# Patient Record
Sex: Male | Born: 1955 | Race: White | Hispanic: No | Smoking: Never smoker
Health system: Southern US, Community
[De-identification: ages and names within clinical notes are randomized; demographics above are authoritative.]

## PROBLEM LIST (undated history)

## (undated) DIAGNOSIS — I509 Heart failure, unspecified: Secondary | ICD-10-CM

## (undated) DIAGNOSIS — R0683 Snoring: Secondary | ICD-10-CM

## (undated) DIAGNOSIS — G7000 Myasthenia gravis without (acute) exacerbation: Secondary | ICD-10-CM

## (undated) DIAGNOSIS — I214 Non-ST elevation (NSTEMI) myocardial infarction: Secondary | ICD-10-CM

## (undated) DIAGNOSIS — K922 Gastrointestinal hemorrhage, unspecified: Secondary | ICD-10-CM

## (undated) DIAGNOSIS — I10 Essential (primary) hypertension: Secondary | ICD-10-CM

## (undated) DIAGNOSIS — K219 Gastro-esophageal reflux disease without esophagitis: Secondary | ICD-10-CM

## (undated) DIAGNOSIS — I639 Cerebral infarction, unspecified: Secondary | ICD-10-CM

## (undated) DIAGNOSIS — E7849 Other hyperlipidemia: Secondary | ICD-10-CM

## (undated) HISTORY — DX: Other hyperlipidemia: E78.49

## (undated) HISTORY — DX: Gastrointestinal hemorrhage, unspecified: K92.2

## (undated) HISTORY — DX: Non-ST elevation (NSTEMI) myocardial infarction: I21.4

## (undated) HISTORY — DX: Snoring: R06.83

## (undated) HISTORY — PX: LAPAROSCOPIC CHOLECYSTECTOMY: SUR755

## (undated) HISTORY — DX: Gastro-esophageal reflux disease without esophagitis: K21.9

## (undated) HISTORY — DX: Essential (primary) hypertension: I10

---

## 2013-12-17 DEATH — deceased

## 2016-07-18 DIAGNOSIS — K922 Gastrointestinal hemorrhage, unspecified: Secondary | ICD-10-CM

## 2016-07-18 HISTORY — DX: Gastrointestinal hemorrhage, unspecified: K92.2

## 2016-08-08 DIAGNOSIS — G7000 Myasthenia gravis without (acute) exacerbation: Secondary | ICD-10-CM | POA: Diagnosis present

## 2016-08-08 DIAGNOSIS — I10 Essential (primary) hypertension: Secondary | ICD-10-CM | POA: Diagnosis present

## 2016-08-08 HISTORY — DX: Essential (primary) hypertension: I10

## 2017-05-01 DIAGNOSIS — E7849 Other hyperlipidemia: Secondary | ICD-10-CM | POA: Insufficient documentation

## 2017-05-01 HISTORY — DX: Other hyperlipidemia: E78.49

## 2018-01-03 ENCOUNTER — Inpatient Hospital Stay (HOSPITAL_COMMUNITY)
Admission: EM | Admit: 2018-01-03 | Discharge: 2018-01-06 | DRG: 281 | Disposition: A | Discharge status: Home / Self Care | Payer: BLUE CROSS/BLUE SHIELD | Attending: Internal Medicine | Admitting: Internal Medicine

## 2018-01-03 ENCOUNTER — Encounter (HOSPITAL_COMMUNITY): Payer: Self-pay | Admitting: Emergency Medicine

## 2018-01-03 ENCOUNTER — Emergency Department (HOSPITAL_COMMUNITY): Payer: BLUE CROSS/BLUE SHIELD

## 2018-01-03 DIAGNOSIS — Z886 Allergy status to analgesic agent status: Secondary | ICD-10-CM | POA: Diagnosis not present

## 2018-01-03 DIAGNOSIS — I110 Hypertensive heart disease with heart failure: Secondary | ICD-10-CM

## 2018-01-03 DIAGNOSIS — Z8673 Personal history of transient ischemic attack (TIA), and cerebral infarction without residual deficits: Secondary | ICD-10-CM | POA: Diagnosis not present

## 2018-01-03 DIAGNOSIS — Z881 Allergy status to other antibiotic agents status: Secondary | ICD-10-CM | POA: Diagnosis not present

## 2018-01-03 DIAGNOSIS — G7000 Myasthenia gravis without (acute) exacerbation: Secondary | ICD-10-CM | POA: Diagnosis present

## 2018-01-03 DIAGNOSIS — I5030 Unspecified diastolic (congestive) heart failure: Secondary | ICD-10-CM

## 2018-01-03 DIAGNOSIS — I214 Non-ST elevation (NSTEMI) myocardial infarction: Principal | ICD-10-CM | POA: Diagnosis present

## 2018-01-03 DIAGNOSIS — Z6841 Body Mass Index (BMI) 40.0 and over, adult: Secondary | ICD-10-CM

## 2018-01-03 DIAGNOSIS — Z79899 Other long term (current) drug therapy: Secondary | ICD-10-CM | POA: Diagnosis not present

## 2018-01-03 DIAGNOSIS — N179 Acute kidney failure, unspecified: Secondary | ICD-10-CM | POA: Diagnosis not present

## 2018-01-03 DIAGNOSIS — I5032 Chronic diastolic (congestive) heart failure: Secondary | ICD-10-CM | POA: Diagnosis present

## 2018-01-03 DIAGNOSIS — R749 Abnormal serum enzyme level, unspecified: Secondary | ICD-10-CM

## 2018-01-03 DIAGNOSIS — E785 Hyperlipidemia, unspecified: Secondary | ICD-10-CM | POA: Diagnosis present

## 2018-01-03 DIAGNOSIS — R079 Chest pain, unspecified: Secondary | ICD-10-CM | POA: Diagnosis not present

## 2018-01-03 DIAGNOSIS — R0600 Dyspnea, unspecified: Secondary | ICD-10-CM

## 2018-01-03 DIAGNOSIS — Z888 Allergy status to other drugs, medicaments and biological substances status: Secondary | ICD-10-CM

## 2018-01-03 DIAGNOSIS — I700 Atherosclerosis of aorta: Secondary | ICD-10-CM | POA: Diagnosis present

## 2018-01-03 DIAGNOSIS — I10 Essential (primary) hypertension: Secondary | ICD-10-CM | POA: Diagnosis present

## 2018-01-03 DIAGNOSIS — I447 Left bundle-branch block, unspecified: Secondary | ICD-10-CM | POA: Diagnosis present

## 2018-01-03 DIAGNOSIS — R000 Tachycardia, unspecified: Secondary | ICD-10-CM | POA: Diagnosis present

## 2018-01-03 DIAGNOSIS — I130 Hypertensive heart and chronic kidney disease with heart failure and stage 1 through stage 4 chronic kidney disease, or unspecified chronic kidney disease: Secondary | ICD-10-CM | POA: Diagnosis present

## 2018-01-03 DIAGNOSIS — I252 Old myocardial infarction: Secondary | ICD-10-CM | POA: Insufficient documentation

## 2018-01-03 DIAGNOSIS — I69328 Other speech and language deficits following cerebral infarction: Secondary | ICD-10-CM

## 2018-01-03 DIAGNOSIS — E669 Obesity, unspecified: Secondary | ICD-10-CM | POA: Diagnosis not present

## 2018-01-03 DIAGNOSIS — Z823 Family history of stroke: Secondary | ICD-10-CM

## 2018-01-03 DIAGNOSIS — R011 Cardiac murmur, unspecified: Secondary | ICD-10-CM | POA: Diagnosis present

## 2018-01-03 DIAGNOSIS — R0682 Tachypnea, not elsewhere classified: Secondary | ICD-10-CM | POA: Diagnosis present

## 2018-01-03 DIAGNOSIS — Z7982 Long term (current) use of aspirin: Secondary | ICD-10-CM

## 2018-01-03 DIAGNOSIS — N189 Chronic kidney disease, unspecified: Secondary | ICD-10-CM | POA: Diagnosis present

## 2018-01-03 HISTORY — DX: Old myocardial infarction: I25.2

## 2018-01-03 HISTORY — DX: Essential (primary) hypertension: I10

## 2018-01-03 HISTORY — DX: Myasthenia gravis without (acute) exacerbation: G70.00

## 2018-01-03 HISTORY — DX: Heart failure, unspecified: I50.9

## 2018-01-03 HISTORY — DX: Non-ST elevation (NSTEMI) myocardial infarction: I21.4

## 2018-01-03 HISTORY — DX: Cerebral infarction, unspecified: I63.9

## 2018-01-03 LAB — CBC
HCT: 43.4 % (ref 39.0–52.0)
Hemoglobin: 15 g/dL (ref 13.0–17.0)
MCH: 28.9 pg (ref 26.0–34.0)
MCHC: 34.6 g/dL (ref 30.0–36.0)
MCV: 83.6 fL (ref 78.0–100.0)
PLATELETS: 229 10*3/uL (ref 150–400)
RBC: 5.19 MIL/uL (ref 4.22–5.81)
RDW: 13.2 % (ref 11.5–15.5)
WBC: 7.6 10*3/uL (ref 4.0–10.5)

## 2018-01-03 LAB — I-STAT TROPONIN, ED: TROPONIN I, POC: 0.15 ng/mL — AB (ref 0.00–0.08)

## 2018-01-03 LAB — BASIC METABOLIC PANEL
ANION GAP: 13 (ref 5–15)
BUN: 16 mg/dL (ref 6–20)
CALCIUM: 9.9 mg/dL (ref 8.9–10.3)
CHLORIDE: 106 mmol/L (ref 101–111)
CO2: 22 mmol/L (ref 22–32)
Creatinine, Ser: 1.47 mg/dL — ABNORMAL HIGH (ref 0.61–1.24)
GFR, EST AFRICAN AMERICAN: 57 mL/min — AB (ref >60)
GFR, EST NON AFRICAN AMERICAN: 49 mL/min — AB (ref >60)
Glucose, Bld: 141 mg/dL — ABNORMAL HIGH (ref 65–99)
POTASSIUM: 3.7 mmol/L (ref 3.5–5.1)
SODIUM: 141 mmol/L (ref 135–145)

## 2018-01-03 LAB — BRAIN NATRIURETIC PEPTIDE: B Natriuretic Peptide: 13.2 pg/mL (ref 0.0–100.0)

## 2018-01-03 LAB — TROPONIN I
Troponin I: 0.14 ng/mL (ref <0.03)
Troponin I: 8.29 ng/mL (ref <0.03)

## 2018-01-03 MED ORDER — IOPAMIDOL (ISOVUE-370) INJECTION 76%
INTRAVENOUS | Status: AC
  Filled 2018-01-03: qty 100

## 2018-01-03 MED ORDER — IOPAMIDOL (ISOVUE-370) INJECTION 76%: 100.0000 mL | Freq: Once | INTRAVENOUS | Status: DC | PRN

## 2018-01-03 MED ORDER — HEPARIN (PORCINE) IN NACL 100-0.45 UNIT/ML-% IJ SOLN
1550.0000 [IU]/h | INTRAMUSCULAR | Status: DC
  Administered 2018-01-03: 1500 [IU]/h via INTRAVENOUS
  Administered 2018-01-05: 1550 [IU]/h via INTRAVENOUS
  Filled 2018-01-03 (×4): qty 250

## 2018-01-03 MED ORDER — IOPAMIDOL (ISOVUE-370) INJECTION 76%
100.0000 mL | Freq: Once | INTRAVENOUS | Status: AC | PRN
  Administered 2018-01-03: 100 mL via INTRAVENOUS

## 2018-01-03 MED ORDER — FUROSEMIDE 10 MG/ML IJ SOLN
40.0000 mg | Freq: Once | INTRAMUSCULAR | Status: AC
  Administered 2018-01-03: 40 mg via INTRAVENOUS
  Filled 2018-01-03: qty 4

## 2018-01-03 NOTE — H&P (Addendum)
Date: 01/03/2018               Patient Name:  Michael Webster MRN: [id number]  DOB: [birth year] Age / Sex: 62 y.o., male   PCP: Clide Dales, PA         Medical Service: Internal Medicine Teaching Service         Attending Physician: Dr. Earl Lagos, MD    First Contact: Dr. Minda Meo  Pager: [phone]  Second Contact: Dr. Nelson Chimes Pager: [phone]       After Hours (After 5p/  First Contact Pager: [phone]  weekends / holidays): Second Contact Pager: [phone]    Chief Complaint: chest pain, SOB  History of Present Illness: 62 year old male with past medical history of myasthenia gravis severe requiring intubation, ?HFpEF, CVA 2010, HTN, HLD.  He presented after calling an ambulance due to substernal chest pain and pressure that began in the [place] parking lot after buying his groceries.  He was putting them in the car and he suddenly felt intense chest pain with associated shortness of breath where he felt like someone was sitting on his chest.  The pain did not radiate, it was constant and unrelenting, it was not pleuritic. he attempted to drive to what he thought was urgent care but when he went into the shopping center there was no urgent care center he flex someone down to call EMS.  When EMS arrived he was given nitroglycerin and oxygen which relieved his chest pain and he felt less much less short of breath as well.  He denies any current chest pain or shortness of breath.  He denied any nausea or vomiting, diarrhea.  He reports no vision changes. He has never had a heart attack before, never had any chest pain symptoms before.  He reports being compliant with all his medicines for blood pressure cholesterol and myasthenia.    ED course: Patient arrived mildly tachycardic, mildly tachypneic.  A CT angiogram chest was performed which was negative for pulmonary embolism.  The patient's labs were significant for an elevation in troponin to 0.15 i-STAT followed by another result 0.14  regular troponin.  BNP was 13.2 CBC and BMP significant for a 1.47 creatinine which is above the patient's baseline of around 1.  The patient received a dose of IV Lasix 40 mg. cardiology was consulted the patient was started on heparin.    Meds:  Current Meds  Medication Sig  . amLODipine (NORVASC) 10 MG tablet Take 5 mg by mouth daily.  Marland Kitchen aspirin EC 81 MG tablet Take 81 mg by mouth daily.  Marland Kitchen docusate sodium (COLACE) 100 MG capsule Take 100 mg by mouth daily as needed for mild constipation.  Marland Kitchen escitalopram (LEXAPRO) 10 MG tablet Take 10 mg by mouth daily.  . famotidine (PEPCID) 20 MG tablet Take 20 mg by mouth 2 (two) times daily.  . hydrochlorothiazide (HYDRODIURIL) 25 MG tablet Take 25 mg by mouth daily.  Marland Kitchen ketotifen (ZADITOR) 0.025 % ophthalmic solution Place 1 drop into both eyes daily as needed.  Marland Kitchen losartan (COZAAR) 50 MG tablet Take 50 mg by mouth daily.  . mycophenolate (CELLCEPT) 500 MG tablet Take 500 mg by mouth 2 (two) times daily.  . pravastatin (PRAVACHOL) 40 MG tablet Take 40 mg by mouth every evening.  . pyridostigmine (MESTINON) 60 MG tablet Take 60 mg by mouth 4 (four) times daily.  Marland Kitchen senna (SENOKOT) 8.6 MG TABS tablet Take 1 tablet by mouth daily as needed for  mild constipation.     Allergies: Allergies as of 01/03/2018 - Review Complete 01/03/2018  Allergen Reaction Noted  . Beta adrenergic blockers Other (See Comments) 08/08/2016  . Calcium channel blockers Other (See Comments) 08/29/2016  . Naproxen Other (See Comments) 08/08/2016  . Duexis [ibuprofen-famotidine]  01/03/2018  . Erythromycin Other (See Comments) 01/03/2018   Past Medical History:  Diagnosis Date  . Congestive heart failure (CHF) (HCC)   . Hypertension   . Myasthenia gravis (HCC)   . Stroke [facility])     Family History:  Family History  Problem Relation Age of Onset  . Stroke Father   . Heart attack Neg Hx     Social History:  Social History   Socioeconomic History  . Marital status:  Widowed    Spouse name: Not on file  . Number of children: Not on file  . Years of education: Not on file  . Highest education level: Not on file  Occupational History  . Not on file  Social Needs  . Financial resource strain: Not on file  . Food insecurity:    Worry: Not on file    Inability: Not on file  . Transportation needs:    Medical: Not on file    Non-medical: Not on file  Tobacco Use  . Smoking status: Never Smoker  . Smokeless tobacco: Never Used  Substance and Sexual Activity  . Alcohol use: Yes    Comment: Occasional use  . Drug use: Never  . Sexual activity: Not on file  Lifestyle  . Physical activity:    Days per week: Not on file    Minutes per session: Not on file  . Stress: Not on file  Relationships  . Social connections:    Talks on phone: Not on file    Gets together: Not on file    Attends religious service: Not on file    Active member of club or organization: Not on file    Attends meetings of clubs or organizations: Not on file    Relationship status: Not on file  . Intimate partner violence:    Fear of current or ex partner: Not on file    Emotionally abused: Not on file    Physically abused: Not on file    Forced sexual activity: Not on file  Other Topics Concern  . Not on file  Social History Narrative  . Not on file    Review of Systems: A complete ROS was negative except as per HPI.   Physical Exam: Blood pressure (!) 143/100, pulse 86, temperature 97.9 F (36.6 C), temperature source Oral, resp. rate 17, SpO2 94 %. Physical Exam  Constitutional: He appears well-developed and well-nourished.  HENT:  Head: Normocephalic and atraumatic.  Eyes: Right eye exhibits no discharge. Left eye exhibits no discharge. No scleral icterus.  Neck: No JVD present.  Cardiovascular: Normal rate, regular rhythm and intact distal pulses. Exam reveals distant heart sounds. Exam reveals no gallop and no friction rub.  No murmur  heard. Pulmonary/Chest: Effort normal and breath sounds normal. No respiratory distress. He has no wheezes. He has no rales.  Abdominal: Soft. Bowel sounds are normal. He exhibits no distension and no mass. There is no tenderness. There is no guarding.  Musculoskeletal:       Right lower leg: He exhibits edema (trace).       Left lower leg: He exhibits edema (trace).  Neurological: He is alert.  Skin: Skin is warm and dry.  EKG: personally reviewed my interpretation is SR, LBBB (chronic), LAE  CXR: personally reviewed my interpretation is no pleural effusions, possible very mild interstitial edema  Assessment & Plan by Problem: Active Problems:   Chest pain  NSTEMI: pt with classic anginal symptoms relieved by nitro, elevation in troponin to 0.14.  ECG changes chronic LBBB, vitals stable.  Pt with risk factors for CAD obesity, HLD, HTN.  Cardiology consulted and following  -continue heparin -asa -ECHO -repeat ECG -trend troponins -PRN SL Nitro -holding losartan with AKI  -continue pravastatin  get lipid panel decide risk vs benefit given could exacerbate MG if using higher intensity statin -as pt with no anginal symptoms currently would not add BB as could exacerbate MG also -Hgb A1C  AKI: pt with baseline Cr 1.00, here found to be 1.47 given contrast load with CTA and given IV lasix   -hold nephrotoxic agents -repeat BMP   Myasthenia Gravis: severe, 2 years ago required intubation, plasmapharesis, disease activity has been stable as of late.  Follows with Dr. Asencion Gowda Neurology had appt last month.  -continue cellcept  BID -continue mestinon  QID   Dispo: Admit patient to Inpatient with expected length of stay greater than 2 midnights.  Signed: Angelita Ingles, MD 01/03/2018, 9:49 PM

## 2018-01-03 NOTE — ED Provider Notes (Signed)
[facility] EMERGENCY DEPARTMENT Provider Note   CSN: [id number] Arrival date & time: 01/03/18  1635     History   Chief Complaint Chief Complaint  Patient presents with  . Shortness of Breath    HPI Clearnce Michael Webster is a 62 y.o. male.  Patient with history of myasthenia gravis compliant with medications, congestive heart pill compliant with blood pressure medications presents with worsening chest pressure and shortness of breath. It started in the parking lot at the grocery store and he asked him to call 911. Sudden onset shortness of breath.Patient is had sores breath before however unable to tell if different. Possibly weight gain recently. Patient denies blood clot history, recent surgeries. Patient normally goes to other hospital for care.patient says symptoms exertional. No fevers.     Past Medical History:  Diagnosis Date  . Myasthenia gravis (HCC)     There are no active problems to display for this patient.   History reviewed. No pertinent surgical history.      Home Medications    Prior to Admission medications   Medication Sig Start Date End Date Taking? Authorizing Provider  amLODipine (NORVASC) 10 MG tablet Take 5 mg by mouth daily.   Yes [provider]  aspirin EC 81 MG tablet Take 81 mg by mouth daily.   Yes [provider]  docusate sodium (COLACE) 100 MG capsule Take 100 mg by mouth daily as needed for mild constipation.   Yes [provider]  escitalopram (LEXAPRO) 10 MG tablet Take 10 mg by mouth daily.   Yes [provider]  famotidine (PEPCID) 20 MG tablet Take 20 mg by mouth 2 (two) times daily.   Yes [provider]  hydrochlorothiazide (HYDRODIURIL) 25 MG tablet Take 25 mg by mouth daily.   Yes [provider]  ketotifen (ZADITOR) 0.025 % ophthalmic solution Place 1 drop into both eyes daily as needed.   Yes [provider]  losartan (COZAAR) 50 MG tablet Take 50 mg by  mouth daily.   Yes [provider]  mycophenolate (CELLCEPT) 500 MG tablet Take 500 mg by mouth 2 (two) times daily.   Yes [provider]  pravastatin (PRAVACHOL) 40 MG tablet Take 40 mg by mouth every evening.   Yes [provider]  pyridostigmine (MESTINON) 60 MG tablet Take 60 mg by mouth 4 (four) times daily.   Yes [provider]  senna (SENOKOT) 8.6 MG TABS tablet Take 1 tablet by mouth daily as needed for mild constipation.   Yes [provider]    Family History No family history on file.  Social History Social History   Tobacco Use  . Smoking status: Not on file  Substance Use Topics  . Alcohol use: Not on file  . Drug use: Not on file     Allergies   Beta adrenergic blockers; Calcium channel blockers; Naproxen; Duexis [ibuprofen-famotidine]; and Erythromycin   Review of Systems Review of Systems  Constitutional: Negative for chills and fever.  HENT: Negative for congestion.   Eyes: Negative for visual disturbance.  Respiratory: Positive for shortness of breath. Negative for cough.   Cardiovascular: Positive for chest pain and leg swelling.  Gastrointestinal: Negative for abdominal pain and vomiting.  Genitourinary: Negative for dysuria and flank pain.  Musculoskeletal: Negative for back pain, neck pain and neck stiffness.  Skin: Negative for rash.  Neurological: Negative for light-headedness and headaches.     Physical Exam Updated Vital Signs BP 116/80  Pulse (!) 104   Temp 97.9 F (36.6 C) (Oral)   Resp 18   SpO2 96%   Physical Exam  Constitutional: He is oriented to person, place, and time. He appears well-developed and well-nourished.  HENT:  Head: Normocephalic and atraumatic.  Eyes: Conjunctivae are normal. Right eye exhibits no discharge. Left eye exhibits no discharge.  Neck: Normal range of motion. Neck supple. No tracheal deviation present.  Cardiovascular: Normal rate and regular rhythm.    Pulmonary/Chest: Tachypnea noted. No respiratory distress. He has no rales.  Abdominal: Soft. He exhibits no distension. There is no tenderness. There is no guarding.  Musculoskeletal:       Right lower leg: He exhibits edema.       Left lower leg: He exhibits edema.  Neurological: He is alert and oriented to person, place, and time.  Skin: Skin is warm. No rash noted.  Psychiatric: He has a normal mood and affect.  Nursing note and vitals reviewed.    ED Treatments / Results  Labs (all labs ordered are listed, but only abnormal results are displayed) Labs Reviewed  BASIC METABOLIC PANEL - Abnormal; Notable for the following components:      Result Value   Glucose, Bld 141 (*)    Creatinine, Ser 1.47 (*)    GFR calc non Af Amer 49 (*)    GFR calc Af Amer 57 (*)    All other components within normal limits  TROPONIN I - Abnormal; Notable for the following components:   Troponin I 0.14 (*)    All other components within normal limits  I-STAT TROPONIN, ED - Abnormal; Notable for the following components:   Troponin i, poc 0.15 (*)    All other components within normal limits  CBC  BRAIN NATRIURETIC PEPTIDE    EKG EKG Interpretation  Date/Time:  Saturday Jan 03 2018 16:40:32 EDT Ventricular Rate:  115 PR Interval:    QRS Duration: 128 QT Interval:  346 QTC Calculation: 479 R Axis:   -46 Text Interpretation:  Sinus tachycardia Probable left atrial enlargement LVH with IVCD, LAD and secondary repol abnrm Anterior ST elevation, probably due to LVH Borderline prolonged QT interval Confirmed by Blane Ohara [phone]) on 01/03/2018 4:56:36 PM   Radiology Dg Chest 2 View  Result Date: 01/03/2018 CLINICAL DATA:  Shortness of breath, chest pain EXAM: CHEST - 2 VIEW COMPARISON:  None. FINDINGS: Cardiomegaly. Mild vascular congestion and interstitial prominence. No confluent opacities or effusions. No acute bony abnormality. IMPRESSION: Cardiomegaly with vascular congestion.  Interstitial prominence could reflect early interstitial edema. Electronically Signed   By: Charlett Nose M.D.   On: 01/03/2018 17:12   Ct Angio Chest Pe W And/or Wo Contrast  Result Date: 01/03/2018 CLINICAL DATA:  Shortness of Breath EXAM: CT ANGIOGRAPHY CHEST WITH CONTRAST TECHNIQUE: Multidetector CT imaging of the chest was performed using the standard protocol during bolus administration of intravenous contrast. Multiplanar CT image reconstructions and MIPs were obtained to evaluate the vascular anatomy. CONTRAST:  ISOVUE-370 IOPAMIDOL (ISOVUE-370) INJECTION 76% COMPARISON:  None. FINDINGS: Cardiovascular: Heart is normal size. Aorta is normal caliber. Scattered aortic calcifications. No filling defects in the pulmonary arteries to suggest pulmonary emboli. Mediastinum/Nodes: No mediastinal, hilar, or axillary adenopathy. Lungs/Pleura: Lungs are clear. No focal airspace opacities or suspicious nodules. No effusions. Upper Abdomen: Imaging into the upper abdomen shows no acute findings. Musculoskeletal: Chest wall soft tissues are unremarkable. No acute bony abnormality. Review of the MIP images confirms the above findings. IMPRESSION: No  evidence of pulmonary embolus. No acute cardiopulmonary disease. Electronically Signed   By: Charlett Nose M.D.   On: 01/03/2018 19:35    Procedures .Critical Care Performed by: Blane Ohara, MD Authorized by: Blane Ohara, MD   Critical care provider statement:    Critical care time (minutes):  35   Critical care start time:  01/03/2018 7:00 PM   Critical care end time:  01/03/2018 7:35 PM   Critical care time was exclusive of:  Separately billable procedures and treating other patients and teaching time   Critical care was necessary to treat or prevent imminent or life-threatening deterioration of the following conditions:  Cardiac failure   Critical care was time spent personally by me on the following activities:  Evaluation of patient's response to  treatment, examination of patient, ordering and performing treatments and interventions, ordering and review of laboratory studies, ordering and review of radiographic studies, pulse oximetry and discussions with consultants   I assumed direction of critical care for this patient from another provider in my specialty: no     (including critical care time)  Medications Ordered in ED Medications  iopamidol (ISOVUE-370) 76 % injection 100 mL (has no administration in time range)  iopamidol (ISOVUE-370) 76 % injection (has no administration in time range)  furosemide (LASIX) injection 40 mg (40 mg Intravenous Given 01/03/18 1825)  iopamidol (ISOVUE-370) 76 % injection 100 mL (100 mLs Intravenous Contrast Given 01/03/18 1911)     Initial Impression / Assessment and Plan / ED Course  I have reviewed the triage vital signs and the nursing notes.  Pertinent labs & imaging results that were available during my care of the patient were reviewed by me and considered in my medical decision making (see chart for details).    Patient with history of heart failure my senior presents with sudden onsets hortness breath and chest pressure exertional. Patient has abnormal EKG on arrival no old to compare to was able to find old EKG reports in care of everywhere which showed LVH-like pattern and widened QRS. Page cardiology with elevated troponin. CT neg PE.  Trop elevated.   Discussed with cardiology recommended heparin and medicine admission as patient has significant myasthenia gravis complicated medications and they would appreciate their help.  Paged unassigned. The patients results and plan were reviewed and discussed.   Any x-rays performed were independently reviewed by myself.   Differential diagnosis were considered with the presenting HPI.  Medications  iopamidol (ISOVUE-370) 76 % injection 100 mL (has no administration in time range)  iopamidol (ISOVUE-370) 76 % injection (has no  administration in time range)  furosemide (LASIX) injection 40 mg (40 mg Intravenous Given 01/03/18 1825)  iopamidol (ISOVUE-370) 76 % injection 100 mL (100 mLs Intravenous Contrast Given 01/03/18 1911)    Vitals:   01/03/18 1700 01/03/18 1800 01/03/18 1830 01/03/18 1945  BP: 92/61  116/80   Pulse: (!) 113 (!) 113 (!) 103 (!) 104  Resp: 19 (!) Temp:      TempSrc:      SpO2: 95% 98% 95% 96%    Final diagnoses:  NSTEMI (non-ST elevated myocardial infarction) (HCC)  Acute dyspnea    Admission/ observation were discussed with the admitting physician, patient and/or family and they are comfortable with the plan.   Final Clinical Impressions(s) / ED Diagnoses   Final diagnoses:  NSTEMI (non-ST elevated myocardial infarction) [facility])  Acute dyspnea    ED Discharge Orders    None  Blane Ohara, MD 01/03/18 2023
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
## 2018-01-03 NOTE — ED Notes (Signed)
Paged hospitalist for troponin value

## 2018-01-03 NOTE — ED Notes (Signed)
Writer notified EDP Zavitz abnormal I stat trop result.

## 2018-01-03 NOTE — Progress Notes (Signed)
ANTICOAGULATION CONSULT NOTE - Initial Consult  Pharmacy Consult for Heparin Indication: chest pain/ACS  Allergies  Allergen Reactions  . Beta Adrenergic Blockers Other (See Comments)    Hx of Myasthenia Gravis  . Calcium Channel Blockers Other (See Comments)    Hx of Myasthenia Gravis  . Naproxen Other (See Comments)    Bleeding ulcer  . Duexis [Ibuprofen-Famotidine]     Unknown   . Erythromycin Other (See Comments)    Patient Measurements: Ht. 5'10 Wt. 106.7kg Heparin Dosing Weight: 96KG  Vital Signs: Temp: 97.9 F (36.6 C) (05/18 1641) Temp Source: Oral (05/18 1641) BP: 116/80 (05/18 1830) Pulse Rate: 104 (05/18 1945)  Labs: Recent Labs    01/03/18 1709  HGB 15.0  HCT 43.4  PLT 229  CREATININE 1.47*  TROPONINI 0.14*    CrCl cannot be calculated (Unknown ideal weight.).  Medical History: Past Medical History:  Diagnosis Date  . Myasthenia gravis [facility])    Assessment: 62yo male presents with ongoing chest pressure with shortness of breath.  CT neg. For PE and we have been asked to initiate Heparin therapy for ACS.  He is ~ 107kg and 70 inches.  Will use heparin adjusted body weight for dosing.  His CBC is wnl and platelets are 229K.  He is without noted bleeding.  Goal of Therapy:  Heparin level 0.3-0.7 units/ml Monitor platelets by anticoagulation protocol: Yes   Plan:  Heparin 5000 units IV bolus then begin maintenance dose with continuous infusion at 1500 units/hr Check HL in 6 hours and adjust as needed. Monitor daily CBC/HL  Nadara Mustard, PharmD., MS Clinical Pharmacist Pager:  [phone] Thank you for allowing pharmacy to be part of this patients care team. 01/03/2018,8:28 PM

## 2018-01-03 NOTE — ED Triage Notes (Signed)
Per EMS pt from home had acute onset SOB, pt was pale and clammy initially, pt arousal to stimulation, no cardiac h/s, pt was having chest pain no chest pain currently, given 324 mg aspirin, 1 nitro given with pain resolved. 93% 3L, 146/50, 130 HR, RR 24, CBG 146

## 2018-01-04 ENCOUNTER — Other Ambulatory Visit: Payer: Self-pay

## 2018-01-04 ENCOUNTER — Encounter (HOSPITAL_COMMUNITY): Payer: Self-pay | Admitting: *Deleted

## 2018-01-04 DIAGNOSIS — Z881 Allergy status to other antibiotic agents status: Secondary | ICD-10-CM | POA: Diagnosis not present

## 2018-01-04 DIAGNOSIS — I5032 Chronic diastolic (congestive) heart failure: Secondary | ICD-10-CM | POA: Diagnosis present

## 2018-01-04 DIAGNOSIS — Z6841 Body Mass Index (BMI) 40.0 and over, adult: Secondary | ICD-10-CM | POA: Diagnosis not present

## 2018-01-04 DIAGNOSIS — E785 Hyperlipidemia, unspecified: Secondary | ICD-10-CM | POA: Diagnosis present

## 2018-01-04 DIAGNOSIS — E669 Obesity, unspecified: Secondary | ICD-10-CM | POA: Diagnosis not present

## 2018-01-04 DIAGNOSIS — I447 Left bundle-branch block, unspecified: Secondary | ICD-10-CM | POA: Diagnosis present

## 2018-01-04 DIAGNOSIS — Z7982 Long term (current) use of aspirin: Secondary | ICD-10-CM

## 2018-01-04 DIAGNOSIS — N189 Chronic kidney disease, unspecified: Secondary | ICD-10-CM | POA: Diagnosis present

## 2018-01-04 DIAGNOSIS — Z79899 Other long term (current) drug therapy: Secondary | ICD-10-CM | POA: Diagnosis not present

## 2018-01-04 DIAGNOSIS — Z9861 Coronary angioplasty status: Secondary | ICD-10-CM | POA: Diagnosis not present

## 2018-01-04 DIAGNOSIS — I700 Atherosclerosis of aorta: Secondary | ICD-10-CM | POA: Diagnosis present

## 2018-01-04 DIAGNOSIS — I214 Non-ST elevation (NSTEMI) myocardial infarction: Secondary | ICD-10-CM | POA: Diagnosis present

## 2018-01-04 DIAGNOSIS — N179 Acute kidney failure, unspecified: Secondary | ICD-10-CM | POA: Diagnosis present

## 2018-01-04 DIAGNOSIS — Z888 Allergy status to other drugs, medicaments and biological substances status: Secondary | ICD-10-CM | POA: Diagnosis not present

## 2018-01-04 DIAGNOSIS — I10 Essential (primary) hypertension: Secondary | ICD-10-CM | POA: Diagnosis not present

## 2018-01-04 DIAGNOSIS — Z0389 Encounter for observation for other suspected diseases and conditions ruled out: Secondary | ICD-10-CM | POA: Diagnosis not present

## 2018-01-04 DIAGNOSIS — I130 Hypertensive heart and chronic kidney disease with heart failure and stage 1 through stage 4 chronic kidney disease, or unspecified chronic kidney disease: Secondary | ICD-10-CM | POA: Diagnosis present

## 2018-01-04 DIAGNOSIS — R0682 Tachypnea, not elsewhere classified: Secondary | ICD-10-CM | POA: Diagnosis present

## 2018-01-04 DIAGNOSIS — I5030 Unspecified diastolic (congestive) heart failure: Secondary | ICD-10-CM | POA: Diagnosis not present

## 2018-01-04 DIAGNOSIS — Z886 Allergy status to analgesic agent status: Secondary | ICD-10-CM | POA: Diagnosis not present

## 2018-01-04 DIAGNOSIS — R011 Cardiac murmur, unspecified: Secondary | ICD-10-CM | POA: Diagnosis present

## 2018-01-04 DIAGNOSIS — G4700 Insomnia, unspecified: Secondary | ICD-10-CM

## 2018-01-04 DIAGNOSIS — G7000 Myasthenia gravis without (acute) exacerbation: Secondary | ICD-10-CM | POA: Diagnosis present

## 2018-01-04 DIAGNOSIS — I110 Hypertensive heart disease with heart failure: Secondary | ICD-10-CM | POA: Diagnosis not present

## 2018-01-04 DIAGNOSIS — Z8673 Personal history of transient ischemic attack (TIA), and cerebral infarction without residual deficits: Secondary | ICD-10-CM | POA: Diagnosis not present

## 2018-01-04 DIAGNOSIS — Z823 Family history of stroke: Secondary | ICD-10-CM | POA: Diagnosis not present

## 2018-01-04 DIAGNOSIS — R000 Tachycardia, unspecified: Secondary | ICD-10-CM | POA: Diagnosis present

## 2018-01-04 DIAGNOSIS — I69328 Other speech and language deficits following cerebral infarction: Secondary | ICD-10-CM | POA: Diagnosis not present

## 2018-01-04 LAB — TROPONIN I: TROPONIN I: 3.92 ng/mL — AB (ref <0.03)

## 2018-01-04 LAB — BASIC METABOLIC PANEL
ANION GAP: 15 (ref 5–15)
BUN: 15 mg/dL (ref 6–20)
CALCIUM: 9.7 mg/dL (ref 8.9–10.3)
CO2: 21 mmol/L — ABNORMAL LOW (ref 22–32)
Chloride: 104 mmol/L (ref 101–111)
Creatinine, Ser: 1.27 mg/dL — ABNORMAL HIGH (ref 0.61–1.24)
GFR calc Af Amer: >60 mL/min (ref >60)
GFR calc non Af Amer: 59 mL/min — ABNORMAL LOW (ref >60)
GLUCOSE: 127 mg/dL — AB (ref 65–99)
Potassium: 3.6 mmol/L (ref 3.5–5.1)
Sodium: 140 mmol/L (ref 135–145)

## 2018-01-04 LAB — LIPID PANEL
CHOLESTEROL: 172 mg/dL (ref 0–200)
HDL: 54 mg/dL (ref >40)
LDL Cholesterol: 94 mg/dL (ref 0–99)
TRIGLYCERIDES: 122 mg/dL (ref <150)
Total CHOL/HDL Ratio: 3.2 RATIO
VLDL: 24 mg/dL (ref 0–40)

## 2018-01-04 LAB — CBC
HEMATOCRIT: 43.9 % (ref 39.0–52.0)
Hemoglobin: 15 g/dL (ref 13.0–17.0)
MCH: 28.7 pg (ref 26.0–34.0)
MCHC: 34.2 g/dL (ref 30.0–36.0)
MCV: 83.9 fL (ref 78.0–100.0)
Platelets: 223 10*3/uL (ref 150–400)
RBC: 5.23 MIL/uL (ref 4.22–5.81)
RDW: 13.6 % (ref 11.5–15.5)
WBC: 7.1 10*3/uL (ref 4.0–10.5)

## 2018-01-04 LAB — MRSA PCR SCREENING: MRSA BY PCR: NEGATIVE

## 2018-01-04 LAB — HEPARIN LEVEL (UNFRACTIONATED)
HEPARIN UNFRACTIONATED: 0.44 [IU]/mL (ref 0.30–0.70)
Heparin Unfractionated: 0.36 IU/mL (ref 0.30–0.70)

## 2018-01-04 LAB — HIV ANTIBODY (ROUTINE TESTING W REFLEX): HIV Screen 4th Generation wRfx: NONREACTIVE

## 2018-01-04 LAB — HEMOGLOBIN A1C
Hgb A1c MFr Bld: 5.3 % (ref 4.8–5.6)
MEAN PLASMA GLUCOSE: 105.41 mg/dL

## 2018-01-04 MED ORDER — FAMOTIDINE 20 MG PO TABS
20.0000 mg | ORAL_TABLET | Freq: Two times a day (BID) | ORAL | Status: DC
Start: 2018-01-04 — End: 2018-01-06
  Administered 2018-01-04 – 2018-01-06 (×5): 20 mg via ORAL
  Filled 2018-01-04 (×5): qty 1

## 2018-01-04 MED ORDER — ASPIRIN 81 MG PO CHEW
81.0000 mg | CHEWABLE_TABLET | ORAL | Status: AC
  Administered 2018-01-05: 81 mg via ORAL
  Filled 2018-01-04: qty 1

## 2018-01-04 MED ORDER — SODIUM CHLORIDE 0.9 % IV SOLN
INTRAVENOUS | Status: AC
  Administered 2018-01-04: 10:00:00 via INTRAVENOUS

## 2018-01-04 MED ORDER — SODIUM CHLORIDE 0.9% FLUSH: 3.0000 mL | INTRAVENOUS | Status: DC | PRN

## 2018-01-04 MED ORDER — DOCUSATE SODIUM 100 MG PO CAPS: 100.0000 mg | ORAL_CAPSULE | Freq: Every day | ORAL | Status: DC | PRN

## 2018-01-04 MED ORDER — ESCITALOPRAM OXALATE 10 MG PO TABS
10.0000 mg | ORAL_TABLET | Freq: Every day | ORAL | Status: DC
  Filled 2018-01-04: qty 1

## 2018-01-04 MED ORDER — PYRIDOSTIGMINE BROMIDE 60 MG PO TABS
60.0000 mg | ORAL_TABLET | Freq: Four times a day (QID) | ORAL | Status: DC
  Administered 2018-01-04 – 2018-01-06 (×8): 60 mg via ORAL
  Filled 2018-01-04 (×9): qty 1

## 2018-01-04 MED ORDER — PRAVASTATIN SODIUM 40 MG PO TABS
40.0000 mg | ORAL_TABLET | Freq: Every evening | ORAL | Status: DC
  Administered 2018-01-04 – 2018-01-05 (×2): 40 mg via ORAL
  Filled 2018-01-04 (×2): qty 1

## 2018-01-04 MED ORDER — PYRIDOSTIGMINE BROMIDE 60 MG PO TABS
60.0000 mg | ORAL_TABLET | Freq: Four times a day (QID) | ORAL | Status: DC
  Administered 2018-01-04: 60 mg via ORAL
  Filled 2018-01-04 (×2): qty 1

## 2018-01-04 MED ORDER — ASPIRIN EC 81 MG PO TBEC
81.0000 mg | DELAYED_RELEASE_TABLET | Freq: Every day | ORAL | Status: DC
  Administered 2018-01-04 – 2018-01-06 (×2): 81 mg via ORAL
  Filled 2018-01-04 (×2): qty 1

## 2018-01-04 MED ORDER — AMLODIPINE BESYLATE 5 MG PO TABS
5.0000 mg | ORAL_TABLET | Freq: Every day | ORAL | Status: DC
  Administered 2018-01-04 – 2018-01-06 (×3): 5 mg via ORAL
  Filled 2018-01-04 (×3): qty 1

## 2018-01-04 MED ORDER — SODIUM CHLORIDE 0.9 % WEIGHT BASED INFUSION
3.0000 mL/kg/h | INTRAVENOUS | Status: DC
  Administered 2018-01-05: 3 mL/kg/h via INTRAVENOUS

## 2018-01-04 MED ORDER — SODIUM CHLORIDE 0.9% FLUSH
3.0000 mL | Freq: Two times a day (BID) | INTRAVENOUS | Status: DC
  Administered 2018-01-05: 3 mL via INTRAVENOUS

## 2018-01-04 MED ORDER — SODIUM CHLORIDE 0.9 % IV SOLN: 250.0000 mL | INTRAVENOUS | Status: DC | PRN

## 2018-01-04 MED ORDER — MYCOPHENOLATE MOFETIL 250 MG PO CAPS
500.0000 mg | ORAL_CAPSULE | Freq: Two times a day (BID) | ORAL | Status: DC
  Administered 2018-01-04 – 2018-01-06 (×6): 500 mg via ORAL
  Filled 2018-01-04 (×7): qty 2

## 2018-01-04 MED ORDER — SENNA 8.6 MG PO TABS: 1.0000 | ORAL_TABLET | Freq: Every day | ORAL | Status: DC | PRN

## 2018-01-04 MED ORDER — PYRIDOSTIGMINE BROMIDE 60 MG PO TABS
60.0000 mg | ORAL_TABLET | Freq: Four times a day (QID) | ORAL | Status: DC
  Filled 2018-01-04 (×3): qty 1

## 2018-01-04 MED ORDER — SODIUM CHLORIDE 0.9 % WEIGHT BASED INFUSION: 1.0000 mL/kg/h | INTRAVENOUS | Status: DC

## 2018-01-04 MED ORDER — NITROGLYCERIN 0.4 MG SL SUBL: 0.4000 mg | SUBLINGUAL_TABLET | SUBLINGUAL | Status: DC | PRN

## 2018-01-04 NOTE — ED Notes (Signed)
Per pharmacy give cellcept even though NPO status

## 2018-01-04 NOTE — Progress Notes (Signed)
   Subjective:  Patient seen and examined. He denies any chest pain overnight and has not had CP since receiving SL nitro from EMS. He denies shortness of breath. He is concerned about receiving his myasthenia gravis medication on time as it effects his speech. Otherwise, no acute complaints.   Objective:  Vital signs in last 24 hours: Vitals:   01/04/18 0500 01/04/18 0515 01/04/18 0545 01/04/18 0600  BP: (!) 149/95 (!) 144/81 134/74 126/86  Pulse: 85 81 89 84  Resp: 15 (!) Temp:      TempSrc:      SpO2: 95% 90% 96% 94%   General: Sitting up in bed comfortably, NAD HEENT: [place]/AT, EOMI, no scleral icterus Cardiac: RRR, No R/M/G appreciated Pulm: normal effort, CTAB Abd: soft, non tender, non distended, BS normal Ext: extremities well perfused, no peripheral edema Neuro: alert and oriented X3, cranial nerves II-XII grossly intact   Assessment/Plan:  Principal Problem:   NSTEMI (non-ST elevated myocardial infarction) (HCC)  NSTEMI Patient presenting with with classic anginal symptoms relieved by nitro. Currently chest pain free and hemodynamically stable. Troponin trend 0.14 >> 8.29 >> 3.92.  On heparin. ECG changes chronic LBBB, repeat ECG this morning similar to admission. Pt with risk factors for CAD obesity, HLD, HTN. -Cardiology consulted, appreciate recs  -Continue heparin per pharm -Aspirin 81 mg daily  -ECHO pending  -PRN SL Nitro -Holding losartan with AKI  -Will continue pravastatin  for now, lipid panel within normal limits - will decide risk vs benefit as high intensity statin could exacerbate MG -Holding beta blocker for now as could exacerbate MG  AKI  Baseline Cr 1.00. On admission Cr 1.47. He was also given contrast load with CTA and IV lasix. Creatinine this AM 1.27.  -Holding nephrotoxic agents -IVF 100 cc/hr NS -Repeat BMP in AM  Myasthenia Gravis Currently stable. But patient has history of severe disease as he required intubation and  plasmapheresis 2 years ago.  -Continue cellcept  BID -Continue Pyridostigmine 60 mg 4 times daily   HTN Normotensive, BP 124/76. Home medications include HCTZ 25 mg daily and Losartan 50 mg daily. -Holding for now in setting of AKI   F: 100 cc/hr NS E: replace as needed N: Diet: NPO pendind cards eval for possible cath DVT ppx: heparin   Dispo: Anticipated discharge in approximately 1-2 days.   Toney Rakes, MD 01/04/2018, 6:47 AM Pager: [phone]

## 2018-01-04 NOTE — ED Notes (Signed)
Placed lunch order 
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
## 2018-01-04 NOTE — Progress Notes (Signed)
ANTICOAGULATION CONSULT NOTE - Follow Up Consult  Pharmacy Consult for heparin Indication: NSTEMI   Labs: Recent Labs    01/03/18 1709 01/03/18 2230 01/04/18 0650  HGB 15.0  --  15.0  HCT 43.4  --  43.9  PLT 229  --  223  HEPARINUNFRC  --   --  0.44  CREATININE 1.47*  --   --   TROPONINI 0.14* 8.29*  --     Assessment/Plan:  62yo male therapeutic on heparin with initial dosing for CP. Will continue gtt at current rate and confirm stable with additional level.   Vernard Gambles, PharmD, BCPS  01/04/2018,7:31 AM

## 2018-01-04 NOTE — Progress Notes (Signed)
ANTICOAGULATION CONSULT NOTE - Follow Up Consult  Pharmacy Consult:  Heparin Indication: NSTEMI  Allergies  Allergen Reactions  . Acetaminophen Other (See Comments)    Bleeding ulcers  . Aminoglycosides Other (See Comments)    Hx of Myasthenia Gravis  . Beta Adrenergic Blockers Other (See Comments)    Hx of Myasthenia Gravis  . Calcium Channel Blockers Other (See Comments)    Hx of Myasthenia Gravis  . Magnesium-Containing Compounds Other (See Comments)    Hx of Myasthenia Gravis ulcer   . Naproxen Other (See Comments)    Bleeding ulcer  . Penicillamine Other (See Comments)    Hx of Myasthenia Gravis  . Quinine Derivatives Other (See Comments)    Hx of Myasthenia Gravis  . Tubocurarine Other (See Comments)    Hx of Myasthenia Gravis  . Duexis [Ibuprofen-Famotidine]     Unknown   . Erythromycin Other (See Comments)    Patient Measurements: Height:  (177.8 cm) Weight: 285 lb 15 oz (129.7 kg) IBW/kg (Calculated) : 73 Heparin Dosing Weight: 103 kg  Vital Signs: BP: 132/79 (05/19 1130) Pulse Rate: 83 (05/19 1130)  Labs: Recent Labs    01/03/18 1709 01/03/18 2230 01/04/18 0650 01/04/18 1333  HGB 15.0  --  15.0  --   HCT 43.4  --  43.9  --   PLT 229  --  223  --   HEPARINUNFRC  --   --  0.44 0.36  CREATININE 1.47*  --  1.27*  --   TROPONINI 0.14* 8.29* 3.92*  --     Estimated Creatinine Clearance: 81.6 mL/min (A) (by C-G formula based on SCr of 1.27 mg/dL (H)).     Assessment: 61 YOF on IV heparin for NSTEMI.  Heparin level therapeutic and trending down.  No bleeding reported.   Goal of Therapy:  Heparin level 0.3-0.7 units/ml Monitor platelets by anticoagulation protocol: Yes    Plan:  Increase heparin gtt slightly to 1550 units/hr F/U AM labs   Rakeem Colley D. Laney Potash, PharmD, BCPS, BCCCP Pager:  [phone] 01/04/2018, 2:14 PM

## 2018-01-05 ENCOUNTER — Encounter (HOSPITAL_COMMUNITY)
Admission: EM | Disposition: A | Discharge status: Home / Self Care | Payer: Self-pay | Source: Home / Self Care | Attending: Internal Medicine

## 2018-01-05 ENCOUNTER — Inpatient Hospital Stay (HOSPITAL_COMMUNITY): Payer: BLUE CROSS/BLUE SHIELD

## 2018-01-05 DIAGNOSIS — I5032 Chronic diastolic (congestive) heart failure: Secondary | ICD-10-CM

## 2018-01-05 DIAGNOSIS — I214 Non-ST elevation (NSTEMI) myocardial infarction: Secondary | ICD-10-CM

## 2018-01-05 DIAGNOSIS — I10 Essential (primary) hypertension: Secondary | ICD-10-CM

## 2018-01-05 HISTORY — PX: LEFT HEART CATH AND CORONARY ANGIOGRAPHY: CATH118249

## 2018-01-05 LAB — BASIC METABOLIC PANEL
Anion gap: 10 (ref 5–15)
BUN: 15 mg/dL (ref 6–20)
CO2: 25 mmol/L (ref 22–32)
Calcium: 9.3 mg/dL (ref 8.9–10.3)
Chloride: 106 mmol/L (ref 101–111)
Creatinine, Ser: 1.28 mg/dL — ABNORMAL HIGH (ref 0.61–1.24)
GFR, EST NON AFRICAN AMERICAN: 58 mL/min — AB (ref >60)
Glucose, Bld: 104 mg/dL — ABNORMAL HIGH (ref 65–99)
POTASSIUM: 4 mmol/L (ref 3.5–5.1)
Sodium: 141 mmol/L (ref 135–145)

## 2018-01-05 LAB — CBC
HCT: 42.2 % (ref 39.0–52.0)
Hemoglobin: 14.1 g/dL (ref 13.0–17.0)
MCH: 28.9 pg (ref 26.0–34.0)
MCHC: 33.4 g/dL (ref 30.0–36.0)
MCV: 86.5 fL (ref 78.0–100.0)
PLATELETS: 190 10*3/uL (ref 150–400)
RBC: 4.88 MIL/uL (ref 4.22–5.81)
RDW: 13.8 % (ref 11.5–15.5)
WBC: 5.8 10*3/uL (ref 4.0–10.5)

## 2018-01-05 LAB — ECHOCARDIOGRAM COMPLETE
HEIGHTINCHES: 70 in
Weight: 4574.99 oz

## 2018-01-05 LAB — PROTIME-INR
INR: 1.01
Prothrombin Time: 13.2 seconds (ref 11.4–15.2)

## 2018-01-05 LAB — HEPARIN LEVEL (UNFRACTIONATED): HEPARIN UNFRACTIONATED: 0.52 [IU]/mL (ref 0.30–0.70)

## 2018-01-05 SURGERY — LEFT HEART CATH AND CORONARY ANGIOGRAPHY
Anesthesia: LOCAL

## 2018-01-05 MED ORDER — SODIUM CHLORIDE 0.9 % IV SOLN: INTRAVENOUS | Status: DC

## 2018-01-05 MED ORDER — ACETAMINOPHEN 325 MG PO TABS: 650.0000 mg | ORAL_TABLET | ORAL | Status: DC | PRN

## 2018-01-05 MED ORDER — PERFLUTREN LIPID MICROSPHERE
INTRAVENOUS | Status: AC
  Administered 2018-01-05: 2.2 mg
  Filled 2018-01-05: qty 10

## 2018-01-05 MED ORDER — ONDANSETRON HCL 4 MG/2ML IJ SOLN: 4.0000 mg | Freq: Four times a day (QID) | INTRAMUSCULAR | Status: DC | PRN

## 2018-01-05 MED ORDER — IOHEXOL 350 MG/ML SOLN
INTRAVENOUS | Status: DC | PRN
  Administered 2018-01-05: 70 mL via INTRA_ARTERIAL

## 2018-01-05 MED ORDER — PERFLUTREN LIPID MICROSPHERE
INTRAVENOUS | Status: AC
  Filled 2018-01-05: qty 10

## 2018-01-05 MED ORDER — SODIUM CHLORIDE 0.9% FLUSH
3.0000 mL | Freq: Two times a day (BID) | INTRAVENOUS | Status: DC
  Administered 2018-01-05 – 2018-01-06 (×2): 3 mL via INTRAVENOUS

## 2018-01-05 MED ORDER — VERAPAMIL HCL 2.5 MG/ML IV SOLN
INTRAVENOUS | Status: AC
  Filled 2018-01-05: qty 2

## 2018-01-05 MED ORDER — FENTANYL CITRATE (PF) 100 MCG/2ML IJ SOLN
INTRAMUSCULAR | Status: DC | PRN
  Administered 2018-01-05: 50 ug via INTRAVENOUS

## 2018-01-05 MED ORDER — DIAZEPAM 5 MG PO TABS: 5.0000 mg | ORAL_TABLET | Freq: Four times a day (QID) | ORAL | Status: DC | PRN

## 2018-01-05 MED ORDER — ASPIRIN 81 MG PO CHEW: 81.0000 mg | CHEWABLE_TABLET | Freq: Every day | ORAL | Status: DC

## 2018-01-05 MED ORDER — SODIUM CHLORIDE 0.9% FLUSH: 3.0000 mL | INTRAVENOUS | Status: DC | PRN

## 2018-01-05 MED ORDER — HEPARIN (PORCINE) IN NACL 2-0.9 UNITS/ML
INTRAMUSCULAR | Status: AC | PRN
  Administered 2018-01-05 (×2): 500 mL via INTRA_ARTERIAL

## 2018-01-05 MED ORDER — HEPARIN (PORCINE) IN NACL 1000-0.9 UT/500ML-% IV SOLN
INTRAVENOUS | Status: AC
  Filled 2018-01-05: qty 1000

## 2018-01-05 MED ORDER — MIDAZOLAM HCL 2 MG/2ML IJ SOLN
INTRAMUSCULAR | Status: AC
  Filled 2018-01-05: qty 2

## 2018-01-05 MED ORDER — LIDOCAINE HCL (PF) 1 % IJ SOLN
INTRAMUSCULAR | Status: AC
  Filled 2018-01-05: qty 30

## 2018-01-05 MED ORDER — MIDAZOLAM HCL 2 MG/2ML IJ SOLN
INTRAMUSCULAR | Status: DC | PRN
  Administered 2018-01-05: 2 mg via INTRAVENOUS

## 2018-01-05 MED ORDER — FENTANYL CITRATE (PF) 100 MCG/2ML IJ SOLN
INTRAMUSCULAR | Status: AC
  Filled 2018-01-05: qty 2

## 2018-01-05 MED ORDER — HEPARIN SODIUM (PORCINE) 1000 UNIT/ML IJ SOLN
INTRAMUSCULAR | Status: DC | PRN
  Administered 2018-01-05: 6500 [IU] via INTRAVENOUS

## 2018-01-05 MED ORDER — HEPARIN SODIUM (PORCINE) 1000 UNIT/ML IJ SOLN
INTRAMUSCULAR | Status: AC
  Filled 2018-01-05: qty 1

## 2018-01-05 MED ORDER — SODIUM CHLORIDE 0.9 % IV SOLN: 250.0000 mL | INTRAVENOUS | Status: DC | PRN

## 2018-01-05 MED ORDER — HEPARIN (PORCINE) IN NACL 1000-0.9 UT/500ML-% IV SOLN
INTRAVENOUS | Status: AC
  Filled 2018-01-05: qty 500

## 2018-01-05 MED ORDER — LIDOCAINE HCL (PF) 1 % IJ SOLN
INTRAMUSCULAR | Status: DC | PRN
  Administered 2018-01-05: 2 mL

## 2018-01-05 MED ORDER — SODIUM CHLORIDE 0.9% FLUSH: 3.0000 mL | Freq: Two times a day (BID) | INTRAVENOUS | Status: DC

## 2018-01-05 MED ORDER — ASPIRIN 81 MG PO CHEW: 81.0000 mg | CHEWABLE_TABLET | ORAL | Status: DC

## 2018-01-05 MED ORDER — VERAPAMIL HCL 2.5 MG/ML IV SOLN
INTRAVENOUS | Status: DC | PRN
  Administered 2018-01-05: 14:00:00 via INTRA_ARTERIAL

## 2018-01-05 SURGICAL SUPPLY — 15 items
CATH INFINITI 5 FR JL3.5 (CATHETERS) ×2 IMPLANT
CATH INFINITI 5FR AL1 (CATHETERS) ×2 IMPLANT
CATH INFINITI 5FR ANG PIGTAIL (CATHETERS) ×2 IMPLANT
CATH OPTITORQUE TIG 4.0 5F (CATHETERS) ×2 IMPLANT
DEVICE RAD COMP TR BAND LRG (VASCULAR PRODUCTS) ×2 IMPLANT
GUIDEWIRE INQWIRE 1.5J.035X260 (WIRE) ×1 IMPLANT
INQWIRE 1.5J .035X260CM (WIRE) ×2
KIT HEART LEFT (KITS) ×2 IMPLANT
NEEDLE PERC 21GX4CM (NEEDLE) ×2 IMPLANT
NEEDLE PERC ENTRY 21G 2.5CM (NEEDLE) ×2 IMPLANT
PACK CARDIAC CATHETERIZATION (CUSTOM PROCEDURE TRAY) ×2 IMPLANT
SHEATH RAIN RADIAL 21G 6FR (SHEATH) ×2 IMPLANT
SYR MEDRAD MARK V 150ML (SYRINGE) ×2 IMPLANT
TRANSDUCER W/STOPCOCK (MISCELLANEOUS) ×2 IMPLANT
TUBING CIL FLEX 10 FLL-RA (TUBING) ×2 IMPLANT

## 2018-01-05 NOTE — Progress Notes (Signed)
   Subjective:  Patient seen and examined. No acute events overnight. Denies chest pain or shortness of breath. Slept well. Discussed plan for cardiac cath today. No acute complaints.   Objective:  Vital signs in last 24 hours: Vitals:   01/04/18 1915 01/04/18 2301 01/05/18 0328 01/05/18 0741  BP: 132/74 140/82 (!) 140/92 134/85  Pulse: 73 68 72 70  Resp: Temp: 98.1 F (36.7 C) 98 F (36.7 C) 97.7 F (36.5 C) (!) 97.5 F (36.4 C)  TempSrc: Oral Oral Oral Oral  SpO2: 98% 98% 98% 98%  Weight:      Height:       General: Sitting up in bed comfortably, NAD HEENT: [place]/AT, EOMI, no scleral icterus Cardiac: RRR, No R/M/G appreciated Pulm: normal effort, CTAB Abd: soft, non tender, non distended, BS normal Ext: extremities well perfused, no peripheral edema Neuro: alert and oriented X3, cranial nerves II-XII grossly intact   Assessment/Plan:  Principal Problem:   NSTEMI (non-ST elevated myocardial infarction) (HCC)  NSTEMI Currently chest pain free and hemodynamically stable. Troponin trend 0.14 >> 8.29 >> 3.92.  On heparin. Plan for [facility] cath and coronary angiography today.  -Cardiology consulted, appreciate recs  -Continue heparin per pharm -Aspirin 81 mg daily  -ECHO pending  -PRN SL Nitro -Holding losartan with AKI  -Will continue pravastatin  for now, lipid panel within normal limits - will decide risk vs benefit as high intensity statin could exacerbate MG -Holding beta blocker for now as could exacerbate MG  AKI  Baseline Cr 1.00. On admission Cr 1.47. Creatinine improving, 1.28 today. Received > 1 liter IVF. -Holding nephrotoxic agents -Repeat BMP in AM  Myasthenia Gravis Currently stable. Patient has history of severe disease as he required intubation and plasmapheresis 2 years ago.  -Continue cellcept  BID -Continue Pyridostigmine 60 mg 4 times daily  -Beta blocker contraindicated   HTN Normotensive, BP 134/85. Home medications include  amlodipine 5 mg, HCTZ 25 mg daily and Losartan 50 mg daily. -Continue Amlodipine 5 mg daily  -Holding HCTZ and losartan in setting of AKI  Chronic diastolic congestive heart failure  Most recent ECHO in care everywhere with EF 60-65% and G1DD. Received >1 liter of IVF in setting of AKI. Appears euvolemic on examination today, lungs CTAB and no peripheral edema.  -Will continue to monitor volume status -Holding Losartan for now  F: pre-cath 130 cc/hr E: replace as needed N: Diet: NPO, will resume after cath DVT ppx: heparin   Dispo: Anticipated discharge in approximately 1-2 days.   Toney Rakes, MD 01/05/2018, 9:10 AM Pager: [phone]

## 2018-01-05 NOTE — Progress Notes (Signed)
ANTICOAGULATION CONSULT NOTE - Follow Up Consult  Pharmacy Consult:  Heparin Indication: NSTEMI  Allergies  Allergen Reactions  . Acetaminophen Other (See Comments)    Bleeding ulcers  . Aminoglycosides Other (See Comments)    Hx of Myasthenia Gravis  . Beta Adrenergic Blockers Other (See Comments)    Hx of Myasthenia Gravis  . Calcium Channel Blockers Other (See Comments)    Hx of Myasthenia Gravis  . Magnesium-Containing Compounds Other (See Comments)    Hx of Myasthenia Gravis ulcer   . Naproxen Other (See Comments)    Bleeding ulcer  . Penicillamine Other (See Comments)    Hx of Myasthenia Gravis  . Quinine Derivatives Other (See Comments)    Hx of Myasthenia Gravis  . Tubocurarine Other (See Comments)    Hx of Myasthenia Gravis  . Duexis [Ibuprofen-Famotidine]     Unknown   . Erythromycin Other (See Comments)    Patient Measurements: Height:  (177.8 cm) Weight: 285 lb 15 oz (129.7 kg) IBW/kg (Calculated) : 73 Heparin Dosing Weight: 103 kg  Vital Signs: Temp: 97.5 F (36.4 C) (05/20 0741) Temp Source: Oral (05/20 0741) BP: 134/85 (05/20 0741) Pulse Rate: 70 (05/20 0741)  Labs: Recent Labs    01/03/18 1709 01/03/18 2230 01/04/18 0650 01/04/18 1333 01/05/18 0336  HGB 15.0  --  15.0  --  14.1  HCT 43.4  --  43.9  --  42.2  PLT 229  --  223  --  190  LABPROT  --   --   --   --  13.2  INR  --   --   --   --  1.01  HEPARINUNFRC  --   --  0.44 0.36 0.52  CREATININE 1.47*  --  1.27*  --  1.28*  TROPONINI 0.14* 8.29* 3.92*  --   --     Estimated Creatinine Clearance: 81 mL/min (A) (by C-G formula based on SCr of 1.28 mg/dL (H)).  Assessment: 61 YOF on IV heparin for NSTEMI. Heparin level continues to be at goal this am, cbc stable overall. No bleeding reported.  Plan for cath today.   Goal of Therapy:  Heparin level 0.3-0.7 units/ml Monitor platelets by anticoagulation protocol: Yes    Plan:  Heparin gtt at 1550 units/hr F/U plans post  cath  Sheppard Coil PharmD., BCPS Clinical Pharmacist 01/05/2018 9:52 AM

## 2018-01-05 NOTE — Progress Notes (Signed)
Progress Note  Patient Name: Michael Webster Date of Encounter: 01/05/2018  Primary Cardiologist: Sherryl Manges, MD   Subjective   Day 2 non-STEMI.  Troponins peaked at 8.  He remains on IV heparin.  He said no recurrent chest pain.  Plan for diagnostic coronary angiography later today.  Inpatient Medications    Scheduled Meds: . amLODipine  5 mg Oral Daily  . aspirin EC  81 mg Oral Daily  . escitalopram  10 mg Oral Daily  . famotidine  20 mg Oral BID  . mycophenolate  500 mg Oral BID  . pravastatin  40 mg Oral QPM  . pyridostigmine  60 mg Oral QID  . sodium chloride flush  3 mL Intravenous Q12H   Continuous Infusions: . sodium chloride    . sodium chloride 1 mL/kg/hr (01/05/18 0700)  . heparin 1,550 Units/hr (01/05/18 0612)   PRN Meds: sodium chloride, docusate sodium, nitroGLYCERIN, senna, sodium chloride flush   Vital Signs    Vitals:   01/04/18 1915 01/04/18 2301 01/05/18 0328 01/05/18 0741  BP: 132/74 140/82 (!) 140/92 134/85  Pulse: 73 68 72 70  Resp: Temp: 98.1 F (36.7 C) 98 F (36.7 C) 97.7 F (36.5 C) (!) 97.5 F (36.4 C)  TempSrc: Oral Oral Oral Oral  SpO2: 98% 98% 98% 98%  Weight:      Height:        Intake/Output Summary (Last 24 hours) at 01/05/2018 0939 Last data filed at 01/05/2018 1610 Gross per 24 hour  Intake 2144.96 ml  Output 1300 ml  Net 844.96 ml   Filed Weights   01/04/18 1349  Weight: 285 lb 15 oz (129.7 kg)    Telemetry    Sinus rhythm- Personally Reviewed  ECG    Not performed today- Personally Reviewed  Physical Exam   GEN: No acute distress.   Neck: No JVD Cardiac: RRR, no murmurs, rubs, or gallops.  Respiratory: Clear to auscultation bilaterally. GI: Soft, nontender, non-distended  MS: No edema; No deformity. Neuro:  Nonfocal  Psych: Normal affect   Labs    Chemistry Recent Labs  Lab 01/03/18 1709 01/04/18 0650 01/05/18 0336  NA 141 140 141  K 3.7 3.6 4.0  CL 106 104 106  CO2 22 21* 25    GLUCOSE 141* 127* 104*  BUN CREATININE 1.47* 1.27* 1.28*  CALCIUM 9.9 9.7 9.3  GFRNONAA 49* 59* 58*  GFRAA 57* >60 >60  ANIONGAP Hematology Recent Labs  Lab 01/03/18 1709 01/04/18 0650 01/05/18 0336  WBC 7.6 7.1 5.8  RBC 5.19 5.23 4.88  HGB 15.0 15.0 14.1  HCT 43.4 43.9 42.2  MCV 83.6 83.9 86.5  MCH 28.9 28.7 28.9  MCHC 34.6 34.2 33.4  RDW 13.2 13.6 13.8  PLT 229 223 190    Cardiac Enzymes Recent Labs  Lab 01/03/18 1709 01/03/18 2230 01/04/18 0650  TROPONINI 0.14* 8.29* 3.92*    Recent Labs  Lab 01/03/18 1654  TROPIPOC 0.15*     BNP Recent Labs  Lab 01/03/18 1709  BNP 13.2     DDimer No results for input(s): DDIMER in the last 168 hours.   Radiology    Dg Chest 2 View  Result Date: 01/03/2018 CLINICAL DATA:  Shortness of breath, chest pain EXAM: CHEST - 2 VIEW COMPARISON:  None. FINDINGS: Cardiomegaly. Mild vascular congestion and interstitial prominence. No confluent opacities or effusions. No acute bony abnormality. IMPRESSION:  Cardiomegaly with vascular congestion. Interstitial prominence could reflect early interstitial edema. Electronically Signed   By: Charlett Nose M.D.   On: 01/03/2018 17:12   Ct Angio Chest Pe W And/or Wo Contrast  Result Date: 01/03/2018 CLINICAL DATA:  Shortness of Breath EXAM: CT ANGIOGRAPHY CHEST WITH CONTRAST TECHNIQUE: Multidetector CT imaging of the chest was performed using the standard protocol during bolus administration of intravenous contrast. Multiplanar CT image reconstructions and MIPs were obtained to evaluate the vascular anatomy. CONTRAST:  ISOVUE-370 IOPAMIDOL (ISOVUE-370) INJECTION 76% COMPARISON:  None. FINDINGS: Cardiovascular: Heart is normal size. Aorta is normal caliber. Scattered aortic calcifications. No filling defects in the pulmonary arteries to suggest pulmonary emboli. Mediastinum/Nodes: No mediastinal, hilar, or axillary adenopathy. Lungs/Pleura: Lungs are clear. No focal  airspace opacities or suspicious nodules. No effusions. Upper Abdomen: Imaging into the upper abdomen shows no acute findings. Musculoskeletal: Chest wall soft tissues are unremarkable. No acute bony abnormality. Review of the MIP images confirms the above findings. IMPRESSION: No evidence of pulmonary embolus. No acute cardiopulmonary disease. Electronically Signed   By: Charlett Nose M.D.   On: 01/03/2018 19:35    Cardiac Studies   None yet  Patient Profile     62 y.o. male moderately overweight admitted on Saturday with chest pain and positive enzymes which peaked at troponin of 8.  He does have LVH with QRS widening but no acute ST segment changes.  Remains on IV heparin and has had no recurrent chest pain.  He does have a history of hypertension and cath induced him back in 2010 at which time he was told he had no significant coronary disease.  Plans for cardiac catheterization today  Assessment & Plan    1: Non-STEMI- troponins peaked at 8.  Remains on IV heparin.  No acute EKG changes.  Creatinine has improved.  Plan for radial diagnostic heart cath today. The patient understands that risks included but are not limited to stroke (1 in 1000), death (1 in 1000), kidney failure [usually temporary] (1 in 500), bleeding (1 in 200), allergic reaction [possibly serious] (1 in 200). The patient understands and agrees to proceed.  2: Essential hypertension- blood pressure under good control on amlodipine  3: Hyperlipidemia- on pravastatin  For questions or updates, please contact [facility] Please consult [email or web address] for contact info under Cardiology/STEMI.      Signed, [name], MD  01/05/2018, 9:39 AM

## 2018-01-05 NOTE — Progress Notes (Signed)
  Echocardiogram 2D Echocardiogram has been performed.  Belva Chimes 01/05/2018, 11:15 AM

## 2018-01-06 ENCOUNTER — Encounter (HOSPITAL_COMMUNITY): Payer: Self-pay | Admitting: Cardiovascular Disease

## 2018-01-06 DIAGNOSIS — Z0389 Encounter for observation for other suspected diseases and conditions ruled out: Secondary | ICD-10-CM

## 2018-01-06 DIAGNOSIS — Z6841 Body Mass Index (BMI) 40.0 and over, adult: Secondary | ICD-10-CM

## 2018-01-06 DIAGNOSIS — Z9861 Coronary angioplasty status: Secondary | ICD-10-CM

## 2018-01-06 DIAGNOSIS — I10 Essential (primary) hypertension: Secondary | ICD-10-CM

## 2018-01-06 LAB — BASIC METABOLIC PANEL
Anion gap: 10 (ref 5–15)
BUN: 12 mg/dL (ref 6–20)
CHLORIDE: 109 mmol/L (ref 101–111)
CO2: 22 mmol/L (ref 22–32)
Calcium: 9.6 mg/dL (ref 8.9–10.3)
Creatinine, Ser: 1.16 mg/dL (ref 0.61–1.24)
GFR calc non Af Amer: >60 mL/min (ref >60)
Glucose, Bld: 109 mg/dL — ABNORMAL HIGH (ref 65–99)
POTASSIUM: 4.1 mmol/L (ref 3.5–5.1)
Sodium: 141 mmol/L (ref 135–145)

## 2018-01-06 LAB — CBC
HEMATOCRIT: 44 % (ref 39.0–52.0)
HEMOGLOBIN: 14.8 g/dL (ref 13.0–17.0)
MCH: 28.6 pg (ref 26.0–34.0)
MCHC: 33.6 g/dL (ref 30.0–36.0)
MCV: 85.1 fL (ref 78.0–100.0)
Platelets: 186 10*3/uL (ref 150–400)
RBC: 5.17 MIL/uL (ref 4.22–5.81)
RDW: 13.4 % (ref 11.5–15.5)
WBC: 6.7 10*3/uL (ref 4.0–10.5)

## 2018-01-06 MED ORDER — LOSARTAN POTASSIUM 50 MG PO TABS
50.0000 mg | ORAL_TABLET | Freq: Every day | ORAL | Status: DC
  Administered 2018-01-06: 50 mg via ORAL
  Filled 2018-01-06: qty 1

## 2018-01-06 MED ORDER — AMLODIPINE BESYLATE 5 MG PO TABS: 5.0000 mg | ORAL_TABLET | Freq: Every day | ORAL | 0 refills | Status: DC

## 2018-01-06 MED ORDER — NITROGLYCERIN 0.4 MG SL SUBL: 0.4000 mg | SUBLINGUAL_TABLET | SUBLINGUAL | 0 refills | Status: DC | PRN

## 2018-01-06 MED FILL — Perflutren Lipid Microsphere IV Susp 6.52 MG/ML: INTRAVENOUS | Qty: 2 | Status: AC

## 2018-01-06 MED FILL — Heparin Sod (Porcine)-NaCl IV Soln 1000 Unit/500ML-0.9%: INTRAVENOUS | Qty: 1000 | Status: AC

## 2018-01-06 NOTE — Progress Notes (Signed)
CARDIAC REHAB PHASE I   PRE:  Rate/Rhythm: 101 ST  BP:  Supine: 152/94  Sitting:   Standing:    SaO2: 93%RA  MODE:  Ambulation: 300 ft   POST:  Rate/Rhythm: 107 ST  BP:  Supine:   Sitting: 167/99  Standing:    SaO2: 100%RA 1115-1203 Pt walked 300 ft with steady gait and no CP. MI education completed with pt who voiced understanding., reviewed NTG use, MI restrictions, walking for ex, heart healthy food choices and CRP 2. Referring to [facility] program as pt prefers to come here.    Luetta Nutting, RN BSN  01/06/2018 12:00 PM

## 2018-01-06 NOTE — Discharge Summary (Signed)
Name: Michael Webster MRN: [id number] DOB: 1955-12-12 62 y.o. PCP: Clide Dales, PA  Date of Admission: 01/03/2018  4:35 PM Date of Discharge:  Attending Physician: Anne Shutter, MD  Discharge Diagnosis: 1. NSTEMI Principal Problem:   NSTEMI (non-ST elevated myocardial infarction) [name]) Active Problems:   Essential hypertension   Myasthenia gravis (HCC)   Discharge Medications: Allergies as of 01/06/2018      Reactions   Acetaminophen Other (See Comments)   Bleeding ulcers   Aminoglycosides Other (See Comments)   Hx of Myasthenia Gravis   Beta Adrenergic Blockers Other (See Comments)   Hx of Myasthenia Gravis   Calcium Channel Blockers Other (See Comments)   Hx of Myasthenia Gravis   Magnesium-containing Compounds Other (See Comments)   Hx of Myasthenia Gravis ulcer   Naproxen Other (See Comments)   Bleeding ulcer   Penicillamine Other (See Comments)   Hx of Myasthenia Gravis   Quinine Derivatives Other (See Comments)   Hx of Myasthenia Gravis   Tubocurarine Other (See Comments)   Hx of Myasthenia Gravis   Duexis [ibuprofen-famotidine]    Unknown    Erythromycin Other (See Comments)      Medication List    TAKE these medications   amLODipine 5 MG tablet Commonly known as:  NORVASC Take 1 tablet (5 mg total) by mouth daily. What changed:  medication strength   aspirin EC 81 MG tablet Take 81 mg by mouth daily.   docusate sodium 100 MG capsule Commonly known as:  COLACE Take 100 mg by mouth daily as needed for mild constipation.   escitalopram 10 MG tablet Commonly known as:  LEXAPRO Take 10 mg by mouth daily.   famotidine 20 MG tablet Commonly known as:  PEPCID Take 20 mg by mouth 2 (two) times daily.   hydrochlorothiazide 25 MG tablet Commonly known as:  HYDRODIURIL Take 25 mg by mouth daily.   ketotifen 0.025 % ophthalmic solution Commonly known as:  ZADITOR Place 1 drop into both eyes daily as needed.   losartan 50 MG  tablet Commonly known as:  COZAAR Take 50 mg by mouth daily.   mycophenolate 500 MG tablet Commonly known as:  CELLCEPT Take 500 mg by mouth 2 (two) times daily.   nitroGLYCERIN 0.4 MG SL tablet Commonly known as:  NITROSTAT Place 1 tablet (0.4 mg total) under the tongue every 5 (five) minutes as needed for chest pain.   pantoprazole 40 MG tablet Commonly known as:  PROTONIX pantoprazole 40 mg tablet,delayed release  Take 1 tablet every day by oral route.   pravastatin 40 MG tablet Commonly known as:  PRAVACHOL Take 40 mg by mouth every evening.   pyridostigmine 60 MG tablet Commonly known as:  MESTINON Take 60 mg by mouth 4 (four) times daily.   senna 8.6 MG Tabs tablet Commonly known as:  SENOKOT Take 1 tablet by mouth daily as needed for mild constipation.       Disposition and follow-up:   Mr.[name] was discharged from [facility] in Stable condition.  At the hospital follow up visit please address:  1.    NSTEMI -EKG without ST segment changes or evidence of acute ischemia -Troponin peaked at 8 -Remained chest pain free throughout hospitalization -LH cath and coronary angiography without significant coronary obstructive disease -Etiology of ischemic injury possibly vasospasm  -Resumed amlodipine 5 mg at discharge  -Resumed ASA 81 mg and Pravastatin 40 mg (lipid panel WNL, LDL 94) -Please evaluate for  recurrence of chest pain or anginal symptoms -Patient to follow up with [facility], Dr. Graciela Husbands   AKI -Creatinine 1.57 on admission (baseline 1.0) -Treated with IV fluid resuscitation  -Creatinine 1.16 on day of discharge  -Recommend repeat basic metabolic panel at follow up   Hypertension -Resumed amlodipine 5 mg at discharge  -Resumed Losartan 50 mg at discharge  -Resumed HCTZ 25 mg at discharge   Chronic diastolic heart failure  -Patient was euvolemic on examination duration of hospitalization  -Repeat Echo showed EF 60-65%,  no wall motion abnormalities, grade 1 diastolic dysfunction  -LH cath estimated EF 50-55%, LV function was normal  -Continued Losartan 50 mg daily  -Beta blocker contraindicated in myasthenia gravis   Obesity  -Recommend sleep study to evaluate for OSA - please refer patient to sleep study  -Encourage diet and exercise   2.  Labs / imaging needed at time of follow-up: Basic Metabolic panel, Sleep study   3.  Pending labs/ test needing follow-up: none   Follow-up Appointments: Follow-up Information    Real Cons Dionne, PA Follow up.   Specialty:  Family Medicine Contact information: [address] [place] [address] [phone]        Duke Salvia, MD .   Specialty:  Cardiology Contact information: [phone] [address] [place] [address] [phone]           Hospital Course by problem list: Principal Problem:   NSTEMI (non-ST elevated myocardial infarction) [facility]) Active Problems:   Essential hypertension   Myasthenia gravis (HCC)   1. NSTEMI Mr. [name] presented to [facility] Emergency Department with acute onset chest pain, shortness of breath, and diaphoresis. Upon, EMS arrival the patient was hemodynamically stable. He was given SL nitroglycerin, which relieved his chest pain. Chest xray demonstrated mild vascular congestions, but no focal opacity or pleural effusion. CTA was negative for pulmonary embolism. EKG was without acute ischemic changes.  Labs were significant for initial troponin level of 0.14. Cardiology was consulted. Troponin trend: 0.14 >> 8.29 >>3.92. The patient was started on heparin. Left heart catheterization and coronary angiography demonstrated normal coronaries without significant obstructive coronary disease. Medical management was recommended. Transient vasospasm as possible etiology of the patient's chest pain with troponin elevation. Continued aspirin 81 mg, pravastatin 40 mg, and amlodipine 5 mg daily.    2. Acute Kidney Injury Labs on admission were significant for elevated creatinine of 1.57(baseline 1.0). Treated with IV fluid resuscitation and monitored with daily BMPs. AKI resolved with IV fluid resuscitation so likely pre-renal in etiology. Held the patient's HCTZ for duration of hospitalization. Resumed Losartan 50 mg at day of discharge as creatinine 1.16 on day of discharge. Recommend repeat basic metabolic panel at hospital follow up appointment.   3. Essential Hypertension Normotensive to mildly hypertensive throughout admission. As stated above intially held losartan and HCTZ due to AKI. Resumed amlodipine 5 mg daily. At discharge, resumed amlodipine 5 mg,  Losartan 50 mg, and HCTZ 25 mg.  4. Chronic diastolic heart failure  Upon arrival to [facility], the patient's initial chest xray demonstrated mild vascular congestion and interstitial prominenceconcerning for early pulmonary edema. The patient received 20 mg of IV lasix in the ED. He was euvolemic on examination for the duration of hospitalization.  Repeat Echo showed EF 60-65%, no wall motion abnormalities, grade 1 diastolic dysfunction. Continued Losartan 50 mg daily at discharge. Beta blocker contraindicated in myasthenia gravis.  5. Obesity  BMI 41. Cardiology  recommends sleep study to evaluate for OSA.    Discharge Vitals:   BP (!) 158/98 (BP Location: Left Arm)   Pulse 77   Temp 97.9 F (36.6 C) (Oral)   Resp (!) 23   Ht  (1.778 m)   Wt 285 lb 15 oz (129.7 kg)   SpO2 98%   BMI 41.03 kg/m   Pertinent Labs, Studies, and Procedures:  CBC Latest Ref Rng & Units 01/06/2018 01/05/2018 01/04/2018  WBC 4.0 - 10.5 K/uL 6.7 5.8 7.1  Hemoglobin 13.0 - 17.0 g/dL 16.1 09.6 04.5  Hematocrit 39.0 - 52.0 % 44.0 42.2 43.9  Platelets 150 - 400 K/uL 186 190 223   BMP Latest Ref Rng & Units 01/06/2018 01/05/2018 01/04/2018  Glucose 65 - 99 mg/dL 409(W) 119(J) 478(G)  BUN 6 - 20 mg/dL Creatinine 0.61 - 1.24 mg/dL 9.56  2.13(Y) 8.65(H)  Sodium 135 - 145 mmol/L 141 141 140  Potassium 3.5 - 5.1 mmol/L 4.1 4.0 3.6  Chloride 101 - 111 mmol/L 109 106 104  CO2 22 - 32 mmol/L 22 25 21(L)  Calcium 8.9 - 10.3 mg/dL 9.6 9.3 9.7   Lipid Panel     Component Value Date/Time   CHOL 172 01/04/2018 0650   TRIG 122 01/04/2018 0650   HDL 54 01/04/2018 0650   CHOLHDL 3.2 01/04/2018 0650   VLDL 24 01/04/2018 0650   LDLCALC 94 01/04/2018 0650   CHEST - 2 VIEW COMPARISON:  None. FINDINGS: Cardiomegaly. Mild vascular congestion and interstitial prominence. No confluent opacities or effusions. No acute bony abnormality. IMPRESSION: Cardiomegaly with vascular congestion. Interstitial prominence could reflect early interstitial edema  CT ANGIOGRAPHY CHEST WITH CONTRAST CONTRAST:  ISOVUE-370 IOPAMIDOL (ISOVUE-370) INJECTION 76% COMPARISON:  None.  FINDINGS: Cardiovascular: Heart is normal size. Aorta is normal caliber. Scattered aortic calcifications. No filling defects in the pulmonary arteries to suggest pulmonary emboli.  Mediastinum/Nodes: No mediastinal, hilar, or axillary adenopathy.  Lungs/Pleura: Lungs are clear. No focal airspace opacities or suspicious nodules. No effusions.  Upper Abdomen: Imaging into the upper abdomen shows no acute findings.  Musculoskeletal: Chest wall soft tissues are unremarkable. No acute bony abnormality.  Review of the MIP images confirms the above findings.  IMPRESSION: No evidence of pulmonary embolus.  No acute cardiopulmonary disease.  Procedures  LEFT HEART CATH AND CORONARY ANGIOGRAPHY  Conclusion     The left ventricular systolic function is normal.  LV end diastolic pressure is normal.  The left ventricular ejection fraction is 50-55% by visual estimate.   Normal coronary arteries in a dominant left circumflex system.  Left main essentially is a common ostium with trifurcate into an LAD, a very high marginal/ramus intermediate like  vessel and a dominant left circumflex coronary artery.  The RCA is a nondominant small vessel.  Global LV function with an EF of 50 to 55% without definitive wall motion abnormalities.  RECOMMENDATION: No significant coronary obstructive disease is demonstrated on this catheterization.  Question transient vasospasm in the etiology of the patient's chest pain with troponin elevation.  Medical therapy.   Transthoracic Echocardiogram Study Conclusions - Left ventricle: The cavity size was normal. There was mild   concentric hypertrophy. Systolic function was normal. The   estimated ejection fraction was in the range of 60% to 65%. Wall   motion was normal; there were no regional wall motion   abnormalities. There was an increased relative contribution of   atrial contraction to ventricular filling. Doppler parameters are  consistent with abnormal left ventricular relaxation (grade 1   diastolic dysfunction). Doppler parameters are consistent with   high ventricular filling pressure. - Aortic valve: Valve area (VTI): 2.69 cm^2. Valve area (Vmax):   2.89 cm^2. Valve area (Vmean): 2.65 cm^2. - Left atrium: The atrium was moderately dilated. - Tricuspid valve: There was trivial regurgitation. - Pulmonic valve: There was trivial regurgitation. - Pulmonary arteries: PA peak pressure: 32 mm Hg (S).  Discharge Instructions: Discharge Instructions    (HEART FAILURE PATIENTS) Call MD:  Anytime you have any of the following symptoms: 1) 3 pound weight gain in 24 hours or 5 pounds in 1 week 2) shortness of breath, with or without a dry hacking cough 3) swelling in the hands, feet or stomach 4) if you have to sleep on extra pillows at night in order to breathe.   Complete by:  As directed    Call MD for:  redness, tenderness, or signs of infection (pain, swelling, redness, odor or green/yellow discharge around incision site)   Complete by:  As directed    Call MD for:  severe uncontrolled pain    Complete by:  As directed    Call MD for:  temperature >100.4   Complete by:  As directed    Diet - low sodium heart healthy   Complete by:  As directed    Discharge instructions   Complete by:  As directed    Mr. Dierre, [place] were admitted to [facility] after having a heart attack. You were placed on a blood thinner until you were able to have a cardiac catheterization to visualize the vessels (coronary arteries) that supply your heart. You did not have significant obstructive coronary artery disease, so a blocked vessel was not the cause of your heart attack. It is possible that one of your coronary vessels transiently narrowed and blocked blood flow to your heart. This is called vasospasm. You are currently on the proper treatment for that, which is Amlodipine. Continue amlodipine 5 mg daily, pravastatin 40 mg daily, and aspirin 81 mg daily.   The ultrasound of your heart demonstrated that your heart was pumping normally and that you heart valves were also normal. You had no structural abnormalities.   I have prescribed you a medication called nitroglycerin. If you have re-occurrence of your chest pain take 1 tablet sublingually every 5 minutes for your pain and seek medical attention immediately.  Please make a follow up appointment with your primary care provider in 1-2 weeks following your hospitalization.   Increase activity slowly   Complete by:  As directed       Signed: Toney Rakes, MD 01/06/2018, 11:08 AM   Pager: [phone]

## 2018-01-06 NOTE — Progress Notes (Addendum)
   Subjective:  Patient seen and examined. Doing well this morning. Tolerated procedure well. Denies recurrent CP or SOB.   Objective:  Vital signs in last 24 hours: Vitals:   01/05/18 1600 01/05/18 1933 01/05/18 2331 01/06/18 0407  BP:  (!) 163/97 (!) 150/99 (!) 148/92  Pulse: 71 96 78 79  Resp: (!) (!) 22  Temp:  97.9 F (36.6 C) 98.2 F (36.8 C) 98 F (36.7 C)  TempSrc:  Oral Oral Oral  SpO2: 97% 97% 97% 97%  Weight:      Height:       General: Sitting in bedside chair comfortably, NAD HEENT: [place]/AT, EOMI Cardiac: RRR, No R/M/G appreciated Pulm: normal effort, CTAB Abd: soft, non tender, non distended, BS normal Ext: extremities well perfused, no peripheral edema Neuro: alert and oriented X3, cranial nerves II-XII grossly intact   Assessment/Plan:  Principal Problem:   NSTEMI (non-ST elevated myocardial infarction) [facility]) Active Problems:   Essential hypertension   Myasthenia gravis (HCC)  NSTEMI LH cath and coronary angiography without significant coronary obstructive disease. Medical management only. It is possible patient's chest pain and elevated troponin was due to transient vasospasm. Stable for discharge.  -Continue Aspirin 81 mg daily   -Continue pravastatin  -Continue Amlodipine 5 mg daily -Cardiac rehab consulted   AKI  Resolved. Creatinine 1.16 today. Baseline Cr 1.00.  Myasthenia Gravis Currently stable. Patient has history of severe disease as he required intubation and plasmapheresis 2 years ago.  -Continue cellcept  BID -Continue Pyridostigmine 60 mg 4 times daily  -Beta blocker contraindicated   HTN Hyperttensive this AM, BP 158/98. Home medications include amlodipine 5 mg, HCTZ 25 mg daily and Losartan 50 mg daily. Will continue current home medications unless medication changes recommended by cardiology.    -Continue Amlodipine 5 mg daily  -Will restart Losartan 50 mg today as renal function at baseline  -Continue to hold  HCTZ 25 mg, can likely resume at discharge  Chronic diastolic congestive heart failure  ECHO and cath with normal LV function. Echo demonstrated G1DD. Appears euvolemic on examination today, lungs CTAB and no peripheral edema.  -Will restart Losartan 50 mg today as renal function at baseline   F: none E: replace as needed N: Diet HH DVT ppx: SCDs  Dispo: Anticipated discharge today.   Toney Rakes, MD 01/06/2018, 6:53 AM Pager: [phone]

## 2018-01-06 NOTE — Progress Notes (Signed)
Progress Note  Patient Name: Michael Webster Date of Encounter: 01/06/2018  Primary Cardiologist: Sherryl Manges, MD   Subjective   Day 3 non-STEMI.  Troponins peaked at 8.  Cardiac catheterization performed yesterday revealed normal coronary arteries and normal LV function suggesting that the etiology of his non-STEMI was potentially coronary vasospasm.  Inpatient Medications    Scheduled Meds: . amLODipine  5 mg Oral Daily  . aspirin EC  81 mg Oral Daily  . escitalopram  10 mg Oral Daily  . famotidine  20 mg Oral BID  . mycophenolate  500 mg Oral BID  . pravastatin  40 mg Oral QPM  . pyridostigmine  60 mg Oral QID  . sodium chloride flush  3 mL Intravenous Q12H   Continuous Infusions: . sodium chloride 10 mL/hr at 01/05/18 2000  . sodium chloride    . heparin 1,550 Units/hr (01/05/18 0612)   PRN Meds: sodium chloride, acetaminophen, diazepam, docusate sodium, nitroGLYCERIN, ondansetron (ZOFRAN) IV, senna, sodium chloride flush   Vital Signs    Vitals:   01/05/18 2331 01/06/18 0407 01/06/18 0700 01/06/18 0821  BP: (!) 150/99 (!) 148/92 (!) 158/98   Pulse: 78 79 82 77  Resp: 12 (!) 22 (!) 24 (!) 23  Temp: 98.2 F (36.8 C) 98 F (36.7 C) 97.9 F (36.6 C)   TempSrc: Oral Oral Oral   SpO2: 97% 97% 98% 98%  Weight:      Height:        Intake/Output Summary (Last 24 hours) at 01/06/2018 1610 Last data filed at 01/06/2018 0900 Gross per 24 hour  Intake 2360.72 ml  Output 1887 ml  Net 473.72 ml   Filed Weights   01/04/18 1349  Weight: 285 lb 15 oz (129.7 kg)    Telemetry    Sinus rhythm- Personally Reviewed  ECG    Not performed today- Personally Reviewed  Physical Exam   GEN: No acute distress.   Neck: No JVD Cardiac: RRR, no murmurs, rubs, or gallops.  Respiratory: Clear to auscultation bilaterally. GI: Soft, nontender, non-distended  MS: No edema; No deformity. Neuro:  Nonfocal  Psych: Normal affect  Extremity: Right radial puncture site  intact   Labs    Chemistry Recent Labs  Lab 01/03/18 1709 01/04/18 0650 01/05/18 0336  NA 141 140 141  K 3.7 3.6 4.0  CL 106 104 106  CO2 22 21* 25  GLUCOSE 141* 127* 104*  BUN CREATININE 1.47* 1.27* 1.28*  CALCIUM 9.9 9.7 9.3  GFRNONAA 49* 59* 58*  GFRAA 57* >60 >60  ANIONGAP Hematology Recent Labs  Lab 01/04/18 0650 01/05/18 0336 01/06/18 0842  WBC 7.1 5.8 6.7  RBC 5.23 4.88 5.17  HGB 15.0 14.1 14.8  HCT 43.9 42.2 44.0  MCV 83.9 86.5 85.1  MCH 28.7 28.9 28.6  MCHC 34.2 33.4 33.6  RDW 13.6 13.8 13.4  PLT 223 190 186    Cardiac Enzymes Recent Labs  Lab 01/03/18 1709 01/03/18 2230 01/04/18 0650  TROPONINI 0.14* 8.29* 3.92*    Recent Labs  Lab 01/03/18 1654  TROPIPOC 0.15*     BNP Recent Labs  Lab 01/03/18 1709  BNP 13.2     DDimer No results for input(s): DDIMER in the last 168 hours.   Radiology    No results found.  Cardiac Studies   None yet  Patient Profile     62 y.o. male moderately overweight admitted on Saturday with  chest pain and positive enzymes which peaked at troponin of 8.  He does have LVH with QRS widening but no acute ST segment changes.  Remains on IV heparin and has had no recurrent chest pain.  He does have a history of hypertension and cath mostly back in 2010 at which time he was told he had no significant coronary disease.  Cardiac catheterization today revealed normal coronary arteries and normal LV function suggesting that his "non-STEMI and" was related to coronary vasospasm.  Assessment & Plan    1: Non-STEMI- troponins peaked at 8.  Radial diagnostic cath performed yesterday revealed normal LV function and normal coronary arteries suggesting coronary vasospasm.  He is already on amlodipine.  2: Essential hypertension- blood pressure under good control on amlodipine  3: Hyperlipidemia- on pravastatin  Okay to transfer to telemetry.  Cardiac rehabilitation consult.  Anticipate discharge  in the next 24 hours.  For questions or updates, please contact [facility] Please consult [email or web address] for contact info under Cardiology/STEMI.      Signed, [name], MD  01/06/2018, 9:39 AM

## 2018-01-06 NOTE — Progress Notes (Signed)
Removed PIV and applied dressing on Rt. Radial. Explained pt regarding how to take care of Rt. Radial site after catheterization. Pt understood well. Pt's received discharge instructions. Pt took his belongings and left the room. HS [employer]

## 2018-01-15 ENCOUNTER — Encounter: Payer: Self-pay | Admitting: Internal Medicine

## 2018-01-15 ENCOUNTER — Ambulatory Visit: Payer: BLUE CROSS/BLUE SHIELD | Admitting: Internal Medicine

## 2018-01-15 VITALS — BP 122/78 | HR 71 | Ht 70.0 in | Wt 286.8 lb

## 2018-01-15 DIAGNOSIS — G7000 Myasthenia gravis without (acute) exacerbation: Secondary | ICD-10-CM | POA: Diagnosis not present

## 2018-01-15 DIAGNOSIS — E7849 Other hyperlipidemia: Secondary | ICD-10-CM | POA: Diagnosis not present

## 2018-01-15 DIAGNOSIS — I10 Essential (primary) hypertension: Secondary | ICD-10-CM | POA: Diagnosis not present

## 2018-01-15 DIAGNOSIS — I214 Non-ST elevation (NSTEMI) myocardial infarction: Secondary | ICD-10-CM

## 2018-01-15 MED ORDER — HYDROCHLOROTHIAZIDE 25 MG PO TABS: 25.0000 mg | ORAL_TABLET | Freq: Every day | ORAL | 3 refills | Status: AC

## 2018-01-15 MED ORDER — AMLODIPINE BESYLATE 5 MG PO TABS: 5.0000 mg | ORAL_TABLET | Freq: Every day | ORAL | 3 refills | Status: DC

## 2018-01-15 MED ORDER — ISOSORBIDE MONONITRATE ER 30 MG PO TB24: 30.0000 mg | ORAL_TABLET | Freq: Every day | ORAL | 3 refills | Status: DC

## 2018-01-15 MED ORDER — LOSARTAN POTASSIUM 50 MG PO TABS: 50.0000 mg | ORAL_TABLET | Freq: Every day | ORAL | 3 refills | Status: DC

## 2018-01-15 NOTE — Patient Instructions (Addendum)
Medication Instructions:  Your physician has recommended you make the following change in your medication:   Stop Aspirin Stop Losartan Start Imdur , 1 tablet once a day   Labwork: None  Procedures/Testing: None  Follow-Up: Your physician recommends that you schedule a follow-up appointment in: 3 months with Francis Dowse  Your physician recommends that you schedule a follow-up with Cardiac Rehab    Any Additional Special Instructions Will Be Listed Below (If Applicable).     If you need a refill on your cardiac medications before your next appointment, please call your pharmacy.

## 2018-01-15 NOTE — Progress Notes (Signed)
Patient Care Team: Clide Dales, [place] as PCP - General (Family Medicine) Duke Salvia, MD as PCP - Cardiology (Cardiology)   HPI  Michael Webster is a 62 y.o. male Recently discharged from the hospital following a non-STEMI.  His coronary arteries turned out to be clean.  It was presumed secondary to spasm.  Past medical history is notable for myasthenia gravis for which he takes Mestinon.  He has sleep disordered breathing.  Records and Results Reviewed   Past Medical History:  Diagnosis Date  . Congestive heart failure (CHF) (HCC)   . Hypertension   . Myasthenia gravis (HCC)   . Stroke [facility])     Past Surgical History:  Procedure Laterality Date  . LAPAROSCOPIC CHOLECYSTECTOMY    . LEFT HEART CATH AND CORONARY ANGIOGRAPHY N/A 01/05/2018   Procedure: LEFT HEART CATH AND CORONARY ANGIOGRAPHY;  Surgeon: Lennette Bihari, MD;  Location: [facility] INVASIVE CV LAB;  Service: Cardiovascular;  Laterality: N/A;    Current Meds  Medication Sig  . amLODipine (NORVASC) 5 MG tablet Take 1 tablet (5 mg total) by mouth daily.  Marland Kitchen aspirin EC 81 MG tablet Take 81 mg by mouth daily.  Marland Kitchen docusate sodium (COLACE) 100 MG capsule Take 100 mg by mouth daily as needed for mild constipation.  Marland Kitchen escitalopram (LEXAPRO) 10 MG tablet Take 10 mg by mouth daily.  . famotidine (PEPCID) 20 MG tablet Take 20 mg by mouth 2 (two) times daily.  . hydrochlorothiazide (HYDRODIURIL) 25 MG tablet Take 1 tablet (25 mg total) by mouth daily.  Marland Kitchen ketotifen (ZADITOR) 0.025 % ophthalmic solution Place 1 drop into both eyes daily as needed.  Marland Kitchen losartan (COZAAR) 50 MG tablet Take 1 tablet (50 mg total) by mouth daily.  . mycophenolate (CELLCEPT) 500 MG tablet Take 500 mg by mouth 2 (two) times daily.  . nitroGLYCERIN (NITROSTAT) 0.4 MG SL tablet Place 1 tablet (0.4 mg total) under the tongue every 5 (five) minutes as needed for chest pain.  . pantoprazole (PROTONIX) 40 MG tablet pantoprazole 40 mg tablet,delayed  release  Take 1 tablet every day by oral route.  . pravastatin (PRAVACHOL) 40 MG tablet Take 40 mg by mouth every evening.  . pyridostigmine (MESTINON) 60 MG tablet Take 60 mg by mouth 4 (four) times daily.  Marland Kitchen senna (SENOKOT) 8.6 MG TABS tablet Take 1 tablet by mouth daily as needed for mild constipation.  . [DISCONTINUED] amLODipine (NORVASC) 5 MG tablet Take 1 tablet (5 mg total) by mouth daily.  . [DISCONTINUED] hydrochlorothiazide (HYDRODIURIL) 25 MG tablet Take 25 mg by mouth daily.  . [DISCONTINUED] losartan (COZAAR) 50 MG tablet Take 50 mg by mouth daily.    Allergies  Allergen Reactions  . Acetaminophen Other (See Comments)    Bleeding ulcers  . Aminoglycosides Other (See Comments)    Hx of Myasthenia Gravis  . Beta Adrenergic Blockers Other (See Comments)    Hx of Myasthenia Gravis  . Calcium Channel Blockers Other (See Comments)    Hx of Myasthenia Gravis  . Magnesium-Containing Compounds Other (See Comments)    Hx of Myasthenia Gravis ulcer   . Naproxen Other (See Comments)    Bleeding ulcer  . Penicillamine Other (See Comments)    Hx of Myasthenia Gravis  . Quinine Derivatives Other (See Comments)    Hx of Myasthenia Gravis  . Tubocurarine Other (See Comments)    Hx of Myasthenia Gravis  . Duexis [Ibuprofen-Famotidine]     Unknown   .  Erythromycin Other (See Comments)      Review of Systems negative except from HPI and PMH  Physical Exam BP 122/78   Pulse 71   Ht  (1.778 m)   Wt 286 lb 12.8 oz (130.1 kg)   SpO2 97%   BMI 41.15 kg/m  Well developed and nourished in no acute distress HENT normal Neck supple with JVP-flat Clear Regular rate and rhythm, no murmurs or gallops Abd-soft with active BS No Clubbing cyanosis edema Skin-warm and dry A & Oriented  Grossly normal sensory and motor function  ECG sinus at 71 Intervals 18/13/41 Left axis deviation LVH   Assessment and  Plan  NSTEMI>> Spasm presumed   HTN   HLD   Myasthenia  gravis  morbid obesity  sleep disordered  The patient had presented with a non-STEMI.  Coronary arteries were clean.  It was presumed that he had spasm. Appropriate therapeutic interventions include calcium blockers, statins, and nitrates.  Beta-blockers and aspirin are not indicated.  We will modify his medications to accomplish that because these have lower blood pressure we will discontinue his losartan.    follow-up with his PCP regarding a sleep study.  We will refer him to cardiac rehab.  We discussed the importance of weight loss.    Current medicines are reviewed at length with the patient today .  The patient does not  have concerns regarding medicines.

## 2018-01-19 ENCOUNTER — Telehealth (HOSPITAL_COMMUNITY): Payer: Self-pay

## 2018-01-19 NOTE — Telephone Encounter (Signed)
Patients insurance is active and benefits verified through [place] - No co-pay, deductible amount of $7,000/$2,929.81 has been met, out of pocket amount of $7,900/$3,055.60 has been met, 40% co-insurance, and no pre-authorization is required. Passport/reference [id number]  Will contact patient to see if he is interested in the Cardiac Rehab Program. If interested, patient will need to complete follow up appt. Once completed, patient will be contacted for scheduling upon review by the RN Navigator.

## 2018-01-19 NOTE — Telephone Encounter (Signed)
Called patient to see if he is interested in the Cardiac Rehab Program - Patient stated he is interested. 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 A little unsure what he applied for as far as coverage. Will follow up with patient once RN Navigator reviews appt.

## 2018-01-23 ENCOUNTER — Ambulatory Visit: Payer: BLUE CROSS/BLUE SHIELD | Admitting: Internal Medicine

## 2018-01-23 ENCOUNTER — Telehealth (HOSPITAL_COMMUNITY): Payer: Self-pay

## 2018-01-23 NOTE — Telephone Encounter (Signed)
Called patient to schedule Cardiac Rehab - Scheduled orientation on 03/17/2018 at 7:30am. Patient will attend the 6:45am exc class. Mailed packet.

## 2018-02-04 ENCOUNTER — Other Ambulatory Visit: Payer: Self-pay | Admitting: Internal Medicine

## 2018-02-16 ENCOUNTER — Telehealth (HOSPITAL_COMMUNITY): Payer: Self-pay

## 2018-02-16 NOTE — Telephone Encounter (Signed)
Cardiac Rehab Medication Review by a Pharmacist  Does the patient feel that his/her medications are working for him/her?  yes  Has the patient been experiencing any side effects to the medications prescribed?  no  Does the patient measure his/her own blood pressure or blood glucose at home?  yes 130s/70s  Does the patient have any problems obtaining medications due to transportation or finances?   no  Understanding of regimen: good Understanding of indications: good Potential of compliance: good    Pharmacist comments: Patient reports compliance with medication regimen. No issues obtaining meds. No current issues with meds.    Abdulwahab Demelo A Malcome Ambrocio 02/16/2018 4:02 PM

## 2018-02-24 DIAGNOSIS — K219 Gastro-esophageal reflux disease without esophagitis: Secondary | ICD-10-CM | POA: Insufficient documentation

## 2018-02-24 HISTORY — DX: Gastro-esophageal reflux disease without esophagitis: K21.9

## 2018-02-26 ENCOUNTER — Encounter (HOSPITAL_COMMUNITY): Payer: Self-pay

## 2018-02-26 ENCOUNTER — Encounter (HOSPITAL_COMMUNITY)
Admission: RE | Admit: 2018-02-26 | Discharge: 2018-02-26 | Disposition: A | Discharge status: Home / Self Care | Payer: BLUE CROSS/BLUE SHIELD | Source: Ambulatory Visit | Attending: Internal Medicine | Admitting: Internal Medicine

## 2018-02-26 VITALS — Ht 68.0 in | Wt 291.0 lb

## 2018-02-26 DIAGNOSIS — I509 Heart failure, unspecified: Secondary | ICD-10-CM | POA: Insufficient documentation

## 2018-02-26 DIAGNOSIS — Z8673 Personal history of transient ischemic attack (TIA), and cerebral infarction without residual deficits: Secondary | ICD-10-CM | POA: Insufficient documentation

## 2018-02-26 DIAGNOSIS — I214 Non-ST elevation (NSTEMI) myocardial infarction: Secondary | ICD-10-CM | POA: Insufficient documentation

## 2018-02-26 DIAGNOSIS — Z79899 Other long term (current) drug therapy: Secondary | ICD-10-CM | POA: Insufficient documentation

## 2018-02-26 DIAGNOSIS — G7000 Myasthenia gravis without (acute) exacerbation: Secondary | ICD-10-CM | POA: Insufficient documentation

## 2018-02-26 DIAGNOSIS — I110 Hypertensive heart disease with heart failure: Secondary | ICD-10-CM | POA: Insufficient documentation

## 2018-02-26 NOTE — Progress Notes (Signed)
Michael Webster 62 y.o. male DOB: 12/01/1955 MRN: [id number]      Nutrition Note  1. NSTEMI (non-ST elevated myocardial infarction) [facility](HCC)    Past Medical History:  Diagnosis Date  . Congestive heart failure (CHF) (HCC)   . Hypertension   . Myasthenia gravis (HCC)   . Stroke [facility](HCC)    Meds reviewed. pravastatin noted  HT: Ht Readings from Last 1 Encounters:  02/26/18 5\' 8"  (1.727 m)    WT: Wt Readings from Last 5 Encounters:  02/26/18 291 lb 0.1 oz (132 kg)  01/15/18 286 lb 12.8 oz (130.1 kg)  01/04/18 285 lb 15 oz (129.7 kg)     Body mass index is 44.25 kg/m.   Current tobacco use? No   Labs:  Lipid Panel     Component Value Date/Time   CHOL 172 01/04/2018 0650   TRIG 122 01/04/2018 0650   HDL 54 01/04/2018 0650   CHOLHDL 3.2 01/04/2018 0650   VLDL 24 01/04/2018 0650   LDLCALC 94 01/04/2018 0650    Lab Results  Component Value Date   HGBA1C 5.3 01/04/2018   CBG (last 3)  No results for input(s): GLUCAP in the last 72 hours.  Nutrition Note Spoke with pt. Nutrition plan and goals reviewed with pt. Pt is following Step 2 of the Therapeutic Lifestyle Changes diet. Pt wants to lose wt. Pt has not been trying to lose wt actively. Wt loss tips reviewed. Per discussion, pt does not use canned/ convenience foods often. If he eats canned vegetables buys no salt added, and will rinse vegetables. Pt rarely adds salt to food. Pt eats out infrequently. Pt shared he eats ~2x meals a day, discussed eating regularly across the day to help get ahead of hunger and cravings, as well as allowing him to make positive food choices when he sits down for a meal. Pt expressed understanding of the information reviewed. Pt aware of nutrition education classes offered and plans on attending nutrition classes.  Nutrition Diagnosis ? Food-and nutrition-related knowledge deficit related to lack of exposure to information as related to diagnosis of: ? CVD  ? Obesity related to excessive energy intake  as evidenced by a Body mass index is 44.25 kg/m.  Nutrition Intervention ? Pt's individual nutrition plan and goals reviewed with pt. ? Pt given handouts for: ? Nutrition I class ? Nutrition II class   Nutrition Goal(s):   ? Pt to identify and limit food sources of saturated fat, trans fat, and sodium ? Pt to identify food quantities necessary to achieve weight loss of 6-24 lbs. at graduation from cardiac rehab. Goal wt of 255 lb desired.    Plan:  ? Pt to attend nutrition classes ? Nutrition I ? Nutrition II ? Portion Distortion  ? Will provide client-centered nutrition education as part of interdisciplinary care ? Monitor and evaluate progress toward nutrition goal with team.   Ross MarcusAubrey Burklin, MS, RD, LDN 02/26/2018 12:06 PM

## 2018-02-26 NOTE — Progress Notes (Signed)
Cardiac Individual Treatment Plan  Patient Details  Name: Isair Inabinet MRN: [id number] Date of Birth: May 07, 1956 Referring Provider:   Flowsheet Row CARDIAC REHAB PHASE II ORIENTATION from 02/26/2018 in [facility] CARDIAC REHAB  Referring Provider  Duke Salvia MD       Initial Encounter Date:  Flowsheet Row CARDIAC REHAB PHASE II ORIENTATION from 02/26/2018 in [facility] CARDIAC REHAB  Date  02/26/18      Visit Diagnosis: NSTEMI (non-ST elevated myocardial infarction) [facility]) - 12/30/2017   Patient's Home Medications on Admission:  Current Outpatient Medications:  .  amLODipine (NORVASC) 5 MG tablet, Take 1 tablet (5 mg total) by mouth daily., Disp: 90 tablet, Rfl: 3 .  docusate sodium (COLACE) 100 MG capsule, Take 100 mg by mouth daily as needed for mild constipation., Disp: , Rfl:  .  escitalopram (LEXAPRO) 10 MG tablet, Take 10 mg by mouth daily., Disp: , Rfl:  .  famotidine (PEPCID) 20 MG tablet, Take 20 mg by mouth 2 (two) times daily., Disp: , Rfl:  .  hydrochlorothiazide (HYDRODIURIL) 25 MG tablet, Take 1 tablet (25 mg total) by mouth daily., Disp: 90 tablet, Rfl: 3 .  isosorbide mononitrate (IMDUR) 30 MG 24 hr tablet, Take 1 tablet (30 mg total) by mouth daily., Disp: 90 tablet, Rfl: 3 .  ketotifen (ZADITOR) 0.025 % ophthalmic solution, Place 1 drop into both eyes daily as needed., Disp: , Rfl:  .  mycophenolate (CELLCEPT) 500 MG tablet, Take 500 mg by mouth 2 (two) times daily., Disp: , Rfl:  .  nitroGLYCERIN (NITROSTAT) 0.4 MG SL tablet, Place 1 tablet (0.4 mg total) under the tongue every 5 (five) minutes as needed for chest pain., Disp: 30 tablet, Rfl: 0 .  pantoprazole (PROTONIX) 40 MG tablet, pantoprazole 40 mg tablet,delayed release  Take 1 tablet every day by oral route., Disp: , Rfl:  .  pravastatin (PRAVACHOL) 40 MG tablet, Take 40 mg by mouth every evening., Disp: , Rfl:  .  pyridostigmine (MESTINON) 60 MG tablet, Take 60 mg by  mouth 4 (four) times daily., Disp: , Rfl:  .  senna (SENOKOT) 8.6 MG TABS tablet, Take 1 tablet by mouth daily as needed for mild constipation., Disp: , Rfl:   Past Medical History: Past Medical History:  Diagnosis Date  . Congestive heart failure (CHF) (HCC)   . Hypertension   . Myasthenia gravis (HCC)   . Stroke (HCC)     Tobacco Use: Social History   Tobacco Use  Smoking Status Never Smoker  Smokeless Tobacco Never Used    Labs: Recent Review Advice worker    Labs for ITP Cardiac and Pulmonary Rehab Latest Ref Rng & Units 01/04/2018   Cholestrol 0 - 200 mg/dL 782   LDLCALC 0 - 99 mg/dL 94   HDL >95 mg/dL 54   Trlycerides <621 mg/dL 308   Hemoglobin M5H 4.8 - 5.6 % 5.3      Capillary Blood Glucose: No results found for: GLUCAP   Exercise Target Goals: Date: 02/26/18  Exercise Program Goal: Individual exercise prescription set using results from initial 6 min walk test and THRR while considering  patient's activity barriers and safety.   Exercise Prescription Goal: Initial exercise prescription builds to 30-45 minutes a day of aerobic activity, 2-3 days per week.  Home exercise guidelines will be given to patient during program as part of exercise prescription that the participant will acknowledge.  Activity Barriers & Risk Stratification: Activity Barriers & Cardiac  Risk Stratification - 02/26/18 1110    Activity Barriers & Cardiac Risk Stratification          Activity Barriers  Muscular Weakness;Neck/Spine Problems;Arthritis    Comments  Myasthenia Gravis    Cardiac Risk Stratification  High           6 Minute Walk: 6 Minute Walk    6 Minute Walk    Row Name 02/26/18 1038   Phase  Initial   Distance  1522 feet   Walk Time  6 minutes   # of Rest Breaks  0   MPH  2.88   METS  2.97   RPE  11   Perceived Dyspnea   0   VO2 Peak  10.41   Symptoms  No   Resting HR  86 bpm   Resting BP  142/88   Resting Oxygen Saturation   97 %   Exercise Oxygen  Saturation  during 6 min walk  96 %   Max Ex. HR  113 bpm   Max Ex. BP  150/82   2 Minute Post BP  130/80          Oxygen Initial Assessment:   Oxygen Re-Evaluation:   Oxygen Discharge (Final Oxygen Re-Evaluation):   Initial Exercise Prescription: Initial Exercise Prescription - 02/26/18 1000    Date of Initial Exercise RX and Referring Provider          Date  02/26/18    Referring Provider  Duke SalviaKlein, Steven C MD     Expected Discharge Date  05/31/18        Treadmill          MPH  2    Grade  1    Minutes  10    METs  2.81        Bike          Level  0.9    Minutes  10    METs  2.31        NuStep          Level  2    SPM  75    Minutes  10    METs  2.5        Prescription Details          Frequency (times per week)  3x    Duration  Progress to 30 minutes of continuous aerobic without signs/symptoms of physical distress        Intensity          THRR 40-80% of Max Heartrate  63-126    Ratings of Perceived Exertion  11-13    Perceived Dyspnea  0-4        Progression          Progression  Continue progressive overload as per policy without signs/symptoms or physical distress.        Resistance Training          Training Prescription  Yes    Weight  4lbs    Reps  10-15           Perform Capillary Blood Glucose checks as needed.  Exercise Prescription Changes:   Exercise Comments:   Exercise Goals and Review: Exercise Goals    Exercise Goals    Row Name 02/26/18 0805   Increase Physical Activity  Yes   Intervention  Provide advice, education, support and counseling about physical activity/exercise needs.;Develop an individualized exercise prescription for aerobic and resistive training based on initial evaluation findings,  risk stratification, comorbidities and participant's personal goals.   Expected Outcomes  Short Term: Attend rehab on a regular basis to increase amount of physical activity.;Long Term: Add in home exercise to make  exercise part of routine and to increase amount of physical activity.;Long Term: Exercising regularly at least 3-5 days a week.   Increase Strength and Stamina  Yes   Intervention  Develop an individualized exercise prescription for aerobic and resistive training based on initial evaluation findings, risk stratification, comorbidities and participant's personal goals.;Provide advice, education, support and counseling about physical activity/exercise needs.   Expected Outcomes  Short Term: Increase workloads from initial exercise prescription for resistance, speed, and METs.;Short Term: Perform resistance training exercises routinely during rehab and add in resistance training at home;Long Term: Improve cardiorespiratory fitness, muscular endurance and strength as measured by increased METs and functional capacity ( )   Able to understand and use rate of perceived exertion (RPE) scale  Yes   Intervention  Provide education and explanation on how to use RPE scale   Expected Outcomes  Short Term: Able to use RPE daily in rehab to express subjective intensity level;Long Term:  Able to use RPE to guide intensity level when exercising independently   Knowledge and understanding of Target Heart Rate Range (THRR)  Yes   Intervention  Provide education and explanation of THRR including how the numbers were predicted and where they are located for reference   Expected Outcomes  Long Term: Able to use THRR to govern intensity when exercising independently;Short Term: Able to state/look up THRR;Short Term: Able to use daily as guideline for intensity in rehab   Able to check pulse independently  Yes   Intervention  Provide education and demonstration on how to check pulse in carotid and radial arteries.;Review the importance of being able to check your own pulse for safety during independent exercise   Expected Outcomes  Short Term: Able to explain why pulse checking is important during independent exercise;Long  Term: Able to check pulse independently and accurately   Understanding of Exercise Prescription  Yes   Intervention  Provide education, explanation, and written materials on patient's individual exercise prescription   Expected Outcomes  Short Term: Able to explain program exercise prescription;Long Term: Able to explain home exercise prescription to exercise independently          Exercise Goals Re-Evaluation :    Discharge Exercise Prescription (Final Exercise Prescription Changes):   Nutrition:  Target Goals: Understanding of nutrition guidelines, daily intake of sodium 1500mg , cholesterol 200mg , calories 30% from fat and 7% or less from saturated fats, daily to have 5 or more servings of fruits and vegetables.  Biometrics: Pre Biometrics - 02/26/18 1044    Pre Biometrics          Height  5\' 8"  (1.727 m)    Weight  291 lb 0.1 oz (132 kg)    Waist Circumference  54.5 inches    Hip Circumference  49.75 inches    Waist to Hip Ratio  1.1 %    BMI (Calculated)  44.26    Triceps Skinfold  27 mm    % Body Fat  43 %    Grip Strength  44 kg    Flexibility  12.5 in    Single Leg Stand  25.49 seconds            Nutrition Therapy Plan and Nutrition Goals:   Nutrition Assessments:   Nutrition Goals Re-Evaluation:   Nutrition Goals Re-Evaluation:  Nutrition Goals Discharge (Final Nutrition Goals Re-Evaluation):   Psychosocial: Target Goals: Acknowledge presence or absence of significant depression and/or stress, maximize coping skills, provide positive support system. Participant is able to verbalize types and ability to use techniques and skills needed for reducing stress and depression.  Initial Review & Psychosocial Screening: Initial Psych Review & Screening - 02/26/18 0757    Initial Review          Current issues with  None Identified        Family Dynamics          Good Support System?  Yes family love and support         Barriers           Psychosocial barriers to participate in program  There are no identifiable barriers or psychosocial needs.        Screening Interventions          Interventions  Encouraged to exercise           Quality of Life Scores: Quality of Life - 02/26/18 0906    Quality of Life          Select  Quality of Life        Quality of Life Scores          Health/Function Pre  28.6 %    Socioeconomic Pre  25.6 %    Psych/Spiritual Pre  30 %    Family Pre  26.4 %    GLOBAL Pre  27.9 %          Scores of 19 and below usually indicate a poorer quality of life in these areas.  A difference of  2-3 points is a clinically meaningful difference.  A difference of 2-3 points in the total score of the Quality of Life Index has been associated with significant improvement in overall quality of life, self-image, physical symptoms, and general health in studies assessing change in quality of life.  PHQ-9: Recent Review Flowsheet Data    There is no flowsheet data to display.     Interpretation of Total Score  Total Score Depression Severity:  1-4 = Minimal depression, 5-9 = Mild depression, 10-14 = Moderate depression, 15-19 = Moderately severe depression, 20-27 = Severe depression   Psychosocial Evaluation and Intervention:   Psychosocial Re-Evaluation:   Psychosocial Discharge (Final Psychosocial Re-Evaluation):   Vocational Rehabilitation: Provide vocational rehab assistance to qualifying candidates.   Vocational Rehab Evaluation & Intervention:   Education: Education Goals: Education classes will be provided on a weekly basis, covering required topics. Participant will state understanding/return demonstration of topics presented.  Learning Barriers/Preferences: Learning Barriers/Preferences - 02/26/18 0805    Learning Barriers/Preferences          Learning Barriers  Sight    Learning Preferences  Video;Pictoral           Education Topics: Count Your Pulse:  -Group  instruction provided by verbal instruction, demonstration, patient participation and written materials to support subject.  Instructors address importance of being able to find your pulse and how to count your pulse when at home without a heart monitor.  Patients get hands on experience counting their pulse with staff help and individually.   Heart Attack, Angina, and Risk Factor Modification:  -Group instruction provided by verbal instruction, video, and written materials to support subject.  Instructors address signs and symptoms of angina and heart attacks.    Also discuss risk factors for heart disease and how to  make changes to improve heart health risk factors.   Functional Fitness:  -Group instruction provided by verbal instruction, demonstration, patient participation, and written materials to support subject.  Instructors address safety measures for doing things around the house.  Discuss how to get up and down off the floor, how to pick things up properly, how to safely get out of a chair without assistance, and balance training.   Meditation and Mindfulness:  -Group instruction provided by verbal instruction, patient participation, and written materials to support subject.  Instructor addresses importance of mindfulness and meditation practice to help reduce stress and improve awareness.  Instructor also leads participants through a meditation exercise.    Stretching for Flexibility and Mobility:  -Group instruction provided by verbal instruction, patient participation, and written materials to support subject.  Instructors lead participants through series of stretches that are designed to increase flexibility thus improving mobility.  These stretches are additional exercise for major muscle groups that are typically performed during regular warm up and cool down.   Hands Only CPR:  -Group verbal, video, and participation provides a basic overview of AHA guidelines for community CPR.  Role-play of emergencies allow participants the opportunity to practice calling for help and chest compression technique with discussion of AED use.   Hypertension: -Group verbal and written instruction that provides a basic overview of hypertension including the most recent diagnostic guidelines, risk factor reduction with self-care instructions and medication management.    Nutrition I class: Heart Healthy Eating:  -Group instruction provided by PowerPoint slides, verbal discussion, and written materials to support subject matter. The instructor gives an explanation and review of the Therapeutic Lifestyle Changes diet recommendations, which includes a discussion on lipid goals, dietary fat, sodium, fiber, plant stanol/sterol esters, sugar, and the components of a well-balanced, healthy diet.   Nutrition II class: Lifestyle Skills:  -Group instruction provided by PowerPoint slides, verbal discussion, and written materials to support subject matter. The instructor gives an explanation and review of label reading, grocery shopping for heart health, heart healthy recipe modifications, and ways to make healthier choices when eating out.   Diabetes Question & Answer:  -Group instruction provided by PowerPoint slides, verbal discussion, and written materials to support subject matter. The instructor gives an explanation and review of diabetes co-morbidities, pre- and post-prandial blood glucose goals, pre-exercise blood glucose goals, signs, symptoms, and treatment of hypoglycemia and hyperglycemia, and foot care basics.   Diabetes Blitz:  -Group instruction provided by PowerPoint slides, verbal discussion, and written materials to support subject matter. The instructor gives an explanation and review of the physiology behind type 1 and type 2 diabetes, diabetes medications and rational behind using different medications, pre- and post-prandial blood glucose recommendations and Hemoglobin A1c goals,  diabetes diet, and exercise including blood glucose guidelines for exercising safely.    Portion Distortion:  -Group instruction provided by PowerPoint slides, verbal discussion, written materials, and food models to support subject matter. The instructor gives an explanation of serving size versus portion size, changes in portions sizes over the last 20 years, and what consists of a serving from each food group.   Stress Management:  -Group instruction provided by verbal instruction, video, and written materials to support subject matter.  Instructors review role of stress in heart disease and how to cope with stress positively.     Exercising on Your Own:  -Group instruction provided by verbal instruction, power point, and written materials to support subject.  Instructors discuss benefits of exercise, components  of exercise, frequency and intensity of exercise, and end points for exercise.  Also discuss use of nitroglycerin and activating EMS.  Review options of places to exercise outside of rehab.  Review guidelines for sex with heart disease.   Cardiac Drugs I:  -Group instruction provided by verbal instruction and written materials to support subject.  Instructor reviews cardiac drug classes: antiplatelets, anticoagulants, beta blockers, and statins.  Instructor discusses reasons, side effects, and lifestyle considerations for each drug class.   Cardiac Drugs II:  -Group instruction provided by verbal instruction and written materials to support subject.  Instructor reviews cardiac drug classes: angiotensin converting enzyme inhibitors (ACE-I), angiotensin II receptor blockers (ARBs), nitrates, and calcium channel blockers.  Instructor discusses reasons, side effects, and lifestyle considerations for each drug class.   Anatomy and Physiology of the Circulatory System:  Group verbal and written instruction and models provide basic cardiac anatomy and physiology, with the coronary  electrical and arterial systems. Review of: AMI, Angina, Valve disease, Heart Failure, Peripheral Artery Disease, Cardiac Arrhythmia, Pacemakers, and the ICD.   Other Education:  -Group or individual verbal, written, or video instructions that support the educational goals of the cardiac rehab program.   Holiday Eating Survival Tips:  -Group instruction provided by PowerPoint slides, verbal discussion, and written materials to support subject matter. The instructor gives patients tips, tricks, and techniques to help them not only survive but enjoy the holidays despite the onslaught of food that accompanies the holidays.   Knowledge Questionnaire Score: Knowledge Questionnaire Score - 02/26/18 0757    Knowledge Questionnaire Score          Pre Score  18/24            Core Components/Risk Factors/Patient Goals at Admission: Personal Goals and Risk Factors at Admission - 02/26/18 1029    Core Components/Risk Factors/Patient Goals on Admission           Weight Management  Yes;Weight Loss    Intervention  Weight Management: Develop a combined nutrition and exercise program designed to reach desired caloric intake, while maintaining appropriate intake of nutrient and fiber, sodium and fats, and appropriate energy expenditure required for the weight goal.;Weight Management: Provide education and appropriate resources to help participant work on and attain dietary goals.;Weight Management/Obesity: Establish reasonable short term and long term weight goals.    Admit Weight  291 lb 0.1 oz (132 kg)    Goal Weight: Short Term  281 lb (127.5 kg)    Goal Weight: Long Term  271 lb (122.9 kg)    Expected Outcomes  Short Term: Continue to assess and modify interventions until short term weight is achieved;Long Term: Adherence to nutrition and physical activity/exercise program aimed toward attainment of established weight goal;Weight Loss: Understanding of general recommendations for a balanced deficit  meal plan, which promotes 1-2 lb weight loss per week and includes a negative energy balance of [phone] kcal/d;Understanding of distribution of calorie intake throughout the day with the consumption of 4-5 meals/snacks;Understanding recommendations for meals to include 15-35% energy as protein, 25-35% energy from fat, 35-60% energy from carbohydrates, less than 200mg  of dietary cholesterol, 20-35 gm of total fiber daily    Hypertension  Yes    Intervention  Provide education on lifestyle modifcations including regular physical activity/exercise, weight management, moderate sodium restriction and increased consumption of fresh fruit, vegetables, and low fat dairy, alcohol moderation, and smoking cessation.;Monitor prescription use compliance.    Expected Outcomes  Short Term: Continued assessment and intervention until  BP is < 140/65mm HG in hypertensive participants. < 130/54mm HG in hypertensive participants with diabetes, heart failure or chronic kidney disease.;Long Term: Maintenance of blood pressure at goal levels.    Lipids  Yes    Intervention  Provide education and support for participant on nutrition & aerobic/resistive exercise along with prescribed medications to achieve LDL 70mg , HDL >40mg .    Expected Outcomes  Long Term: Cholesterol controlled with medications as prescribed, with individualized exercise RX and with personalized nutrition plan. Value goals: LDL < 70mg , HDL > 40 mg.;Short Term: Participant states understanding of desired cholesterol values and is compliant with medications prescribed. Participant is following exercise prescription and nutrition guidelines.           Core Components/Risk Factors/Patient Goals Review:    Core Components/Risk Factors/Patient Goals at Discharge (Final Review):    ITP Comments: ITP Comments    Row Name 02/26/18 0756   ITP Comments  Dr. Armanda Magic, Medical Director       Comments: Patient attended orientation from 0730 to [phone] to  review rules and guidelines for program. Completed 6 minute walk test, Intitial ITP, and exercise prescription.  VSS. Telemetry-sinus rhythm,   Asymptomatic.  Deveron Furlong, RN, BSN Cardiac Pulmonary Rehab 02/26/18 11:33 AM

## 2018-03-04 ENCOUNTER — Encounter (HOSPITAL_COMMUNITY)
Admission: RE | Admit: 2018-03-04 | Discharge: 2018-03-04 | Disposition: A | Discharge status: Home / Self Care | Payer: BLUE CROSS/BLUE SHIELD | Source: Ambulatory Visit | Attending: Internal Medicine | Admitting: Internal Medicine

## 2018-03-04 ENCOUNTER — Encounter (HOSPITAL_COMMUNITY): Payer: Self-pay

## 2018-03-04 DIAGNOSIS — I214 Non-ST elevation (NSTEMI) myocardial infarction: Secondary | ICD-10-CM

## 2018-03-04 NOTE — Progress Notes (Signed)
Daily Session Note  Patient Details  Name: Michael Webster MRN: [id number] Date of Birth: 07/23/1956 Referring Provider:     [place] from 02/26/2018 in [facility]  Referring Provider  Deboraha Sprang MD       Encounter Date: 03/04/2018  Check In: Session Check In - 03/04/18 0807      Check-In   Location  [facility]    Staff Present  Seward Carol, MS, ACSM CEP, Exercise Physiologist;Annedrea Rosezella [place], RN, MHA;Tyara Nevels, MS,ACSM CEP, Exercise Physiologist;Victorio Creeden Karle Starch, RN BSN    Supervising physician immediately available to respond to emergencies  [facility] immediately available    Physician(s)  Dr. Denton Brick    Medication changes reported      No    Fall or balance concerns reported     No    Tobacco Cessation  No Change    Warm-up and Cool-down  Performed as group-led instruction    Resistance Training Performed  No    VAD Patient?  No    PAD/SET Patient?  No      Pain Assessment   Currently in Pain?  No/denies       Capillary Blood Glucose: No results found for this or any previous visit (from the past 24 hour(s)).    Social History   Tobacco Use  Smoking Status Never Smoker  Smokeless Tobacco Never Used    Goals Met:  Exercise tolerated well  Goals Unmet:  Not Applicable  Comments: Pt started cardiac rehab today.  Pt tolerated light exercise without difficulty. VSS, telemetry-ST, asymptomatic.  Medication list reconciled. Pt denies barriers to medicaiton compliance.  PSYCHOSOCIAL ASSESSMENT:  PHQ-0. Pt exhibits positive coping skills, hopeful outlook with supportive family. No psychosocial needs identified at this time, no psychosocial interventions necessary.  Pt oriented to exercise equipment and routine.    Understanding verbalized.    Dr. Fransico Him is Medical Director for Cardiac Rehab at [facility].

## 2018-03-06 ENCOUNTER — Encounter (HOSPITAL_COMMUNITY)
Admission: RE | Admit: 2018-03-06 | Discharge: 2018-03-06 | Disposition: A | Discharge status: Home / Self Care | Payer: BLUE CROSS/BLUE SHIELD | Source: Ambulatory Visit | Attending: Internal Medicine | Admitting: Internal Medicine

## 2018-03-06 DIAGNOSIS — I214 Non-ST elevation (NSTEMI) myocardial infarction: Secondary | ICD-10-CM

## 2018-03-06 NOTE — Progress Notes (Signed)
Michael Webster 62 y.o. male Nutrition Note Spoke with pt. Nutrition Plan and Nutrition Survey goals reviewed with pt. Pt is following a Heart Healthy diet. Pt wants to lose wt. Pt has not been trying to lose wt actively. Wt loss tips reviewed. Educated pt on basic nutrition fundamentals, complex carbs, healthy fats, non/low-starchy vegetables, and how to build a healthy plate. Additionally discussed how to read labels and appropriate serving sizes for foods. Pt shared he eats ~2x meals a day, discussed eating regularly across the day to help get ahead of hunger and cravings, as well as allowing him to make positive food choices when he sits down for a meal. Pt expressed understanding of the information reviewed. Pt aware of nutrition education classes offered and plans on attending nutrition classes.  Lab Results  Component Value Date   HGBA1C 5.3 01/04/2018    Wt Readings from Last 3 Encounters:  02/26/18 291 lb 0.1 oz (132 kg)  01/15/18 286 lb 12.8 oz (130.1 kg)  01/04/18 285 lb 15 oz (129.7 kg)    Nutrition Diagnosis ? Food-and nutrition-related knowledge deficit related to lack of exposure to information as related to diagnosis of: ? CVD   Obesity related to excessive energy intake as evidenced by a BMI of 44.25 kg/m     Nutrition Intervention ? Pt's individual nutrition plan reviewed with pt. ? Benefits of adopting Heart Healthy diet discussed when [employer] reviewed.   ? Pt given handouts for: ? Nutrition I class ? Nutrition II class ? Diabetes Blitz Class ? Consistent vit K diet ? low sodium ? DM ? pre-diabetes ? Diabetes Q & A class ? Continue client-centered nutrition education by RD, as part of interdisciplinary care.  Goal(s)  ? Pt to identify and limit food sources of saturated fat, trans fat, and sodium ? Pt to identify food quantities necessary to achieve weight loss of 6-24 lb at graduation from cardiac rehab.    Plan:  Pt to attend nutrition classes ? Nutrition I ?  Nutrition II ? Portion Distortion  Will provide client-centered nutrition education as part of interdisciplinary care. Monitor and evaluate progress toward nutrition goal with team.   Ross MarcusAubrey Burklin, MS, RD, LDN 03/06/2018 7:58 AM

## 2018-03-09 ENCOUNTER — Encounter (HOSPITAL_COMMUNITY)
Admission: RE | Admit: 2018-03-09 | Discharge: 2018-03-09 | Disposition: A | Discharge status: Home / Self Care | Payer: BLUE CROSS/BLUE SHIELD | Source: Ambulatory Visit | Attending: Internal Medicine | Admitting: Internal Medicine

## 2018-03-09 DIAGNOSIS — I214 Non-ST elevation (NSTEMI) myocardial infarction: Secondary | ICD-10-CM

## 2018-03-11 ENCOUNTER — Telehealth: Payer: Self-pay

## 2018-03-11 ENCOUNTER — Encounter (HOSPITAL_COMMUNITY)
Admission: RE | Admit: 2018-03-11 | Discharge: 2018-03-11 | Disposition: A | Discharge status: Home / Self Care | Payer: BLUE CROSS/BLUE SHIELD | Source: Ambulatory Visit | Attending: Internal Medicine | Admitting: Internal Medicine

## 2018-03-11 DIAGNOSIS — I214 Non-ST elevation (NSTEMI) myocardial infarction: Secondary | ICD-10-CM

## 2018-03-11 NOTE — Telephone Encounter (Signed)
Spoke with Tara from [facility] on 7/19 regarding pt's resting HR of 117 bpm. Per Dr Graciela HusbandsKlein, he would like pt to be established with general cardiology for further evaluation regarding his high resting HR. Pt agrees with plan and will await a call from scheduling.

## 2018-03-12 DIAGNOSIS — R0683 Snoring: Secondary | ICD-10-CM

## 2018-03-12 HISTORY — DX: Snoring: R06.83

## 2018-03-13 ENCOUNTER — Encounter (HOSPITAL_COMMUNITY)
Admission: RE | Admit: 2018-03-13 | Discharge: 2018-03-13 | Disposition: A | Discharge status: Home / Self Care | Payer: BLUE CROSS/BLUE SHIELD | Source: Ambulatory Visit | Attending: Internal Medicine | Admitting: Internal Medicine

## 2018-03-13 DIAGNOSIS — I214 Non-ST elevation (NSTEMI) myocardial infarction: Secondary | ICD-10-CM

## 2018-03-16 ENCOUNTER — Encounter (HOSPITAL_COMMUNITY)
Admission: RE | Admit: 2018-03-16 | Discharge: 2018-03-16 | Disposition: A | Discharge status: Home / Self Care | Payer: BLUE CROSS/BLUE SHIELD | Source: Ambulatory Visit | Attending: Internal Medicine | Admitting: Internal Medicine

## 2018-03-16 DIAGNOSIS — I214 Non-ST elevation (NSTEMI) myocardial infarction: Secondary | ICD-10-CM

## 2018-03-17 ENCOUNTER — Ambulatory Visit (HOSPITAL_COMMUNITY): Payer: BLUE CROSS/BLUE SHIELD

## 2018-03-18 ENCOUNTER — Encounter (HOSPITAL_COMMUNITY)
Admission: RE | Admit: 2018-03-18 | Discharge: 2018-03-18 | Disposition: A | Discharge status: Home / Self Care | Payer: BLUE CROSS/BLUE SHIELD | Source: Ambulatory Visit | Attending: Internal Medicine | Admitting: Internal Medicine

## 2018-03-18 DIAGNOSIS — I214 Non-ST elevation (NSTEMI) myocardial infarction: Secondary | ICD-10-CM

## 2018-03-18 NOTE — Progress Notes (Signed)
Reviewed home exercise guidelines with patient including endpoints, temperature precautions, target heart rate and rate of perceived exertion. Pt is currently walking 30 minutes, 4 days/week as his mode of home exercise. Pt is thinking about joining [employer] as another option for exercise. Pt voices understanding of instructions given. Artist Paislinty M Mily Malecki, MS, ACSM CEP

## 2018-03-20 ENCOUNTER — Encounter (HOSPITAL_COMMUNITY)
Admission: RE | Admit: 2018-03-20 | Discharge: 2018-03-20 | Disposition: A | Discharge status: Home / Self Care | Payer: BLUE CROSS/BLUE SHIELD | Source: Ambulatory Visit | Attending: Internal Medicine | Admitting: Internal Medicine

## 2018-03-20 DIAGNOSIS — I509 Heart failure, unspecified: Secondary | ICD-10-CM | POA: Insufficient documentation

## 2018-03-20 DIAGNOSIS — G7000 Myasthenia gravis without (acute) exacerbation: Secondary | ICD-10-CM | POA: Insufficient documentation

## 2018-03-20 DIAGNOSIS — Z79899 Other long term (current) drug therapy: Secondary | ICD-10-CM | POA: Insufficient documentation

## 2018-03-20 DIAGNOSIS — I110 Hypertensive heart disease with heart failure: Secondary | ICD-10-CM | POA: Insufficient documentation

## 2018-03-20 DIAGNOSIS — I214 Non-ST elevation (NSTEMI) myocardial infarction: Secondary | ICD-10-CM | POA: Insufficient documentation

## 2018-03-20 DIAGNOSIS — Z8673 Personal history of transient ischemic attack (TIA), and cerebral infarction without residual deficits: Secondary | ICD-10-CM | POA: Insufficient documentation

## 2018-03-23 ENCOUNTER — Encounter (HOSPITAL_COMMUNITY)
Admission: RE | Admit: 2018-03-23 | Discharge: 2018-03-23 | Disposition: A | Discharge status: Home / Self Care | Payer: BLUE CROSS/BLUE SHIELD | Source: Ambulatory Visit | Attending: Internal Medicine | Admitting: Internal Medicine

## 2018-03-23 ENCOUNTER — Encounter (HOSPITAL_COMMUNITY): Payer: Self-pay

## 2018-03-23 ENCOUNTER — Encounter (HOSPITAL_COMMUNITY): Payer: BLUE CROSS/BLUE SHIELD

## 2018-03-23 DIAGNOSIS — I214 Non-ST elevation (NSTEMI) myocardial infarction: Secondary | ICD-10-CM

## 2018-03-25 ENCOUNTER — Encounter (HOSPITAL_COMMUNITY)
Admission: RE | Admit: 2018-03-25 | Discharge: 2018-03-25 | Disposition: A | Discharge status: Home / Self Care | Payer: BLUE CROSS/BLUE SHIELD | Source: Ambulatory Visit | Attending: Internal Medicine | Admitting: Internal Medicine

## 2018-03-25 ENCOUNTER — Encounter (HOSPITAL_COMMUNITY): Payer: BLUE CROSS/BLUE SHIELD

## 2018-03-25 DIAGNOSIS — I214 Non-ST elevation (NSTEMI) myocardial infarction: Secondary | ICD-10-CM

## 2018-03-26 NOTE — Progress Notes (Signed)
Cardiac Individual Treatment Plan  Patient Details  Name: Michael Webster MRN: [id number] Date of Birth: 1956-02-28 Referring Provider:     CARDIAC REHAB PHASE II ORIENTATION from 02/26/2018 in [facility] CARDIAC REHAB  Referring Provider  Duke Salvia MD       Initial Encounter Date:    CARDIAC REHAB PHASE II ORIENTATION from 02/26/2018 in [facility] CARDIAC REHAB  Date  02/26/18      Visit Diagnosis: NSTEMI (non-ST elevated myocardial infarction) [facility])  Patient's Home Medications on Admission:  Current Outpatient Medications:  .  amLODipine (NORVASC) 5 MG tablet, Take 1 tablet (5 mg total) by mouth daily., Disp: 90 tablet, Rfl: 3 .  docusate sodium (COLACE) 100 MG capsule, Take 100 mg by mouth daily as needed for mild constipation., Disp: , Rfl:  .  escitalopram (LEXAPRO) 10 MG tablet, Take 10 mg by mouth daily., Disp: , Rfl:  .  famotidine (PEPCID) 20 MG tablet, Take 20 mg by mouth 2 (two) times daily., Disp: , Rfl:  .  hydrochlorothiazide (HYDRODIURIL) 25 MG tablet, Take 1 tablet (25 mg total) by mouth daily., Disp: 90 tablet, Rfl: 3 .  isosorbide mononitrate (IMDUR) 30 MG 24 hr tablet, Take 1 tablet (30 mg total) by mouth daily., Disp: 90 tablet, Rfl: 3 .  ketotifen (ZADITOR) 0.025 % ophthalmic solution, Place 1 drop into both eyes daily as needed., Disp: , Rfl:  .  mycophenolate (CELLCEPT) 500 MG tablet, Take 500 mg by mouth 2 (two) times daily., Disp: , Rfl:  .  nitroGLYCERIN (NITROSTAT) 0.4 MG SL tablet, Place 1 tablet (0.4 mg total) under the tongue every 5 (five) minutes as needed for chest pain., Disp: 30 tablet, Rfl: 0 .  pantoprazole (PROTONIX) 40 MG tablet, pantoprazole 40 mg tablet,delayed release  Take 1 tablet every day by oral route., Disp: , Rfl:  .  pravastatin (PRAVACHOL) 40 MG tablet, Take 40 mg by mouth every evening., Disp: , Rfl:  .  pyridostigmine (MESTINON) 60 MG tablet, Take 60 mg by mouth 4 (four) times daily., Disp: , Rfl:   .  senna (SENOKOT) 8.6 MG TABS tablet, Take 1 tablet by mouth daily as needed for mild constipation., Disp: , Rfl:   Past Medical History: Past Medical History:  Diagnosis Date  . Congestive heart failure (CHF) (HCC)   . Hypertension   . Myasthenia gravis (HCC)   . Stroke (HCC)     Tobacco Use: Social History   Tobacco Use  Smoking Status Never Smoker  Smokeless Tobacco Never Used    Labs: Recent Review Advice worker    Labs for ITP Cardiac and Pulmonary Rehab Latest Ref Rng & Units 01/04/2018   Cholestrol 0 - 200 mg/dL 829   LDLCALC 0 - 99 mg/dL 94   HDL >56 mg/dL 54   Trlycerides <213 mg/dL 086   Hemoglobin V7Q 4.8 - 5.6 % 5.3      Capillary Blood Glucose: No results found for: GLUCAP   Exercise Target Goals:    Exercise Program Goal: Individual exercise prescription set using results from initial 6 min walk test and THRR while considering  patient's activity barriers and safety.   Exercise Prescription Goal: Initial exercise prescription builds to 30-45 minutes a day of aerobic activity, 2-3 days per week.  Home exercise guidelines will be given to patient during program as part of exercise prescription that the participant will acknowledge.  Activity Barriers & Risk Stratification: Activity Barriers & Cardiac Risk Stratification -  02/26/18 1110      Activity Barriers & Cardiac Risk Stratification   Activity Barriers  Muscular Weakness;Neck/Spine Problems;Arthritis    Comments  Myasthenia Gravis    Cardiac Risk Stratification  High       6 Minute Walk: 6 Minute Walk    Row Name 02/26/18 1038         6 Minute Walk   Phase  Initial     Distance  1522 feet     Walk Time  6 minutes     # of Rest Breaks  0     MPH  2.88     METS  2.97     RPE  11     Perceived Dyspnea   0     VO2 Peak  10.41     Symptoms  No     Resting HR  86 bpm     Resting BP  142/88     Resting Oxygen Saturation   97 %     Exercise Oxygen Saturation  during 6 min walk  96 %      Max Ex. HR  113 bpm     Max Ex. BP  150/82     2 Minute Post BP  130/80        Oxygen Initial Assessment:   Oxygen Re-Evaluation:   Oxygen Discharge (Final Oxygen Re-Evaluation):   Initial Exercise Prescription: Initial Exercise Prescription - 02/26/18 1000      Date of Initial Exercise RX and Referring Provider   Date  02/26/18    Referring Provider  Duke Salvia MD     Expected Discharge Date  05/31/18      Treadmill   MPH  2    Grade  1    Minutes  10    METs  2.81      Bike   Level  0.9    Minutes  10    METs  2.31      NuStep   Level  2    SPM  75    Minutes  10    METs  2.5      Prescription Details   Frequency (times per week)  3x    Duration  Progress to 30 minutes of continuous aerobic without signs/symptoms of physical distress      Intensity   THRR 40-80% of Max Heartrate  63-126    Ratings of Perceived Exertion  11-13    Perceived Dyspnea  0-4      Progression   Progression  Continue progressive overload as per policy without signs/symptoms or physical distress.      Resistance Training   Training Prescription  Yes    Weight  4lbs    Reps  10-15       Perform Capillary Blood Glucose checks as needed.  Exercise Prescription Changes: Exercise Prescription Changes    Row Name 03/04/18 0900 03/25/18 1500           Response to Exercise   Blood Pressure (Admit)  136/80  128/80      Blood Pressure (Exercise)  164/90  160/64      Blood Pressure (Exit)  150/84  130/80      Heart Rate (Admit)  93 bpm  90 bpm      Heart Rate (Exercise)  145 bpm  112 bpm      Heart Rate (Exit)  97 bpm  77 bpm      Rating of Perceived  Exertion (Exercise)  13  11      Perceived Dyspnea (Exercise)  0  0      Symptoms  None   None       Comments  Pt oriented to exercise equipment   -      Duration  Progress to 30 minutes of  aerobic without signs/symptoms of physical distress  Continue with 30 min of aerobic exercise without signs/symptoms of physical  distress.      Intensity  THRR New  THRR unchanged        Progression   Progression  Continue to progress workloads to maintain intensity without signs/symptoms of physical distress.  Continue to progress workloads to maintain intensity without signs/symptoms of physical distress.      Average METs  2.3  2.68        Resistance Training   Training Prescription  No  No        Interval Training   Interval Training  No  No        Treadmill   MPH  2  2      Grade  1  1      Minutes  10  10      METs  2.81  2.81        Bike   Level  0.9  1.2      Minutes  10  10      METs  2.29  2.75        NuStep   Level  2  3      SPM  75  95      Minutes  10  10      METs  1.8  2.5        Home Exercise Plan   Plans to continue exercise at  -  Home (comment) Walking      Frequency  -  Add 2 additional days to program exercise sessions.      Initial Home Exercises Provided  -  03/18/18         Exercise Comments: Exercise Comments    Row Name 03/04/18 [id number] 03/25/18 1448         Exercise Comments  Pt's first day of exercise. Pt oriented to exercise equipment. Will continue to monitor and progress pt.   Pt is responding well to exercise prescription. Pt is currently walking 4 days, 30 minutes at home. Will continue to monitor and progress pt as tolerated.          Exercise Goals and Review: Exercise Goals    Row Name 02/26/18 0805             Exercise Goals   Increase Physical Activity  Yes       Intervention  Provide advice, education, support and counseling about physical activity/exercise needs.;Develop an individualized exercise prescription for aerobic and resistive training based on initial evaluation findings, risk stratification, comorbidities and participant's personal goals.       Expected Outcomes  Short Term: Attend rehab on a regular basis to increase amount of physical activity.;Long Term: Add in home exercise to make exercise part of routine and to increase amount of  physical activity.;Long Term: Exercising regularly at least 3-5 days a week.       Increase Strength and Stamina  Yes       Intervention  Develop an individualized exercise prescription for aerobic and resistive training based on initial evaluation findings, risk stratification, comorbidities and participant's  personal goals.;Provide advice, education, support and counseling about physical activity/exercise needs.       Expected Outcomes  Short Term: Increase workloads from initial exercise prescription for resistance, speed, and METs.;Short Term: Perform resistance training exercises routinely during rehab and add in resistance training at home;Long Term: Improve cardiorespiratory fitness, muscular endurance and strength as measured by increased METs and functional capacity ( )       Able to understand and use rate of perceived exertion (RPE) scale  Yes       Intervention  Provide education and explanation on how to use RPE scale       Expected Outcomes  Short Term: Able to use RPE daily in rehab to express subjective intensity level;Long Term:  Able to use RPE to guide intensity level when exercising independently       Knowledge and understanding of Target Heart Rate Range (THRR)  Yes       Intervention  Provide education and explanation of THRR including how the numbers were predicted and where they are located for reference       Expected Outcomes  Long Term: Able to use THRR to govern intensity when exercising independently;Short Term: Able to state/look up THRR;Short Term: Able to use daily as guideline for intensity in rehab       Able to check pulse independently  Yes       Intervention  Provide education and demonstration on how to check pulse in carotid and radial arteries.;Review the importance of being able to check your own pulse for safety during independent exercise       Expected Outcomes  Short Term: Able to explain why pulse checking is important during independent exercise;Long  Term: Able to check pulse independently and accurately       Understanding of Exercise Prescription  Yes       Intervention  Provide education, explanation, and written materials on patient's individual exercise prescription       Expected Outcomes  Short Term: Able to explain program exercise prescription;Long Term: Able to explain home exercise prescription to exercise independently          Exercise Goals Re-Evaluation : Exercise Goals Re-Evaluation    Row Name 03/18/18 0727             Exercise Goal Re-Evaluation   Exercise Goals Review  Understanding of Exercise Prescription;Knowledge and understanding of Target Heart Rate Range (THRR);Able to understand and use rate of perceived exertion (RPE) scale       Comments  Reviewed home exercise guidelines with patient including THRR, RPE scale, and endpoints for exercise. Pt is currently walking 30 minutes on T,Th, Sat, and Sun at the mall or [place] as his mode of home exercise. Pt is thinking about joining [employer].       Expected Outcomes  Patient will continue walking 4 days/week in addition to exercie at cardiac rehab to help achieve personal health and fitness goals.           Discharge Exercise Prescription (Final Exercise Prescription Changes): Exercise Prescription Changes - 03/25/18 1500      Response to Exercise   Blood Pressure (Admit)  128/80    Blood Pressure (Exercise)  160/64    Blood Pressure (Exit)  130/80    Heart Rate (Admit)  90 bpm    Heart Rate (Exercise)  112 bpm    Heart Rate (Exit)  77 bpm    Rating of Perceived Exertion (Exercise)  11  Perceived Dyspnea (Exercise)  0    Symptoms  None     Duration  Continue with 30 min of aerobic exercise without signs/symptoms of physical distress.    Intensity  THRR unchanged      Progression   Progression  Continue to progress workloads to maintain intensity without signs/symptoms of physical distress.    Average METs  2.68      Resistance Training    Training Prescription  No      Interval Training   Interval Training  No      Treadmill   MPH  2    Grade  1    Minutes  10    METs  2.81      Bike   Level  1.2    Minutes  10    METs  2.75      NuStep   Level  3    SPM  95    Minutes  10    METs  2.5      Home Exercise Plan   Plans to continue exercise at  Home (comment)    Frequency  Add 2 additional days to program exercise sessions.    Initial Home Exercises Provided  03/18/18       Nutrition:  Target Goals: Understanding of nutrition guidelines, daily intake of sodium 1500mg , cholesterol 200mg , calories 30% from fat and 7% or less from saturated fats, daily to have 5 or more servings of fruits and vegetables.  Biometrics: Pre Biometrics - 02/26/18 1044      Pre Biometrics   Height  5\' 8"  (1.727 m)    Weight  132 kg    Waist Circumference  54.5 inches    Hip Circumference  49.75 inches    Waist to Hip Ratio  1.1 %    BMI (Calculated)  44.26    Triceps Skinfold  27 mm    % Body Fat  43 %    Grip Strength  44 kg    Flexibility  12.5 in    Single Leg Stand  25.49 seconds        Nutrition Therapy Plan and Nutrition Goals: Nutrition Therapy & Goals - 02/26/18 1213      Nutrition Therapy   Diet  heart healthy      Personal Nutrition Goals   Nutrition Goal  Pt to identify food quantities necessary to achieve weight loss of 6-24 lbs. at graduation from cardiac rehab.    Personal Goal #2  Pt to identify and limit food sources of saturated fat, trans fat, and sodium      Intervention Plan   Intervention  Prescribe, educate and counsel regarding individualized specific dietary modifications aiming towards targeted core components such as weight, hypertension, lipid management, diabetes, heart failure and other comorbidities.    Expected Outcomes  Short Term Goal: Understand basic principles of dietary content, such as calories, fat, sodium, cholesterol and nutrients.       Nutrition  Assessments: Nutrition Assessments - 02/26/18 1214      MEDFICTS Scores   Pre Score  30       Nutrition Goals Re-Evaluation: Nutrition Goals Re-Evaluation    Row Name 02/26/18 1213             Goals   Current Weight  291 lb 0.1 oz (132 kg)          Nutrition Goals Re-Evaluation: Nutrition Goals Re-Evaluation    Row Name 02/26/18 1213  Goals   Current Weight  291 lb 0.1 oz (132 kg)          Nutrition Goals Discharge (Final Nutrition Goals Re-Evaluation): Nutrition Goals Re-Evaluation - 02/26/18 1213      Goals   Current Weight  291 lb 0.1 oz (132 kg)       Psychosocial: Target Goals: Acknowledge presence or absence of significant depression and/or stress, maximize coping skills, provide positive support system. Participant is able to verbalize types and ability to use techniques and skills needed for reducing stress and depression.  Initial Review & Psychosocial Screening: Initial Psych Review & Screening - 02/26/18 0757      Initial Review   Current issues with  None Identified      Family Dynamics   Good Support System?  Yes      Barriers   Psychosocial barriers to participate in program  There are no identifiable barriers or psychosocial needs.      Screening Interventions   Interventions  Encouraged to exercise       Quality of Life Scores: Quality of Life - 02/26/18 0906      Quality of Life   Select  Quality of Life      Quality of Life Scores   Health/Function Pre  28.6 %    Socioeconomic Pre  25.6 %    Psych/Spiritual Pre  30 %    Family Pre  26.4 %    GLOBAL Pre  27.9 %      Scores of 19 and below usually indicate a poorer quality of life in these areas.  A difference of  2-3 points is a clinically meaningful difference.  A difference of 2-3 points in the total score of the Quality of Life Index has been associated with significant improvement in overall quality of life, self-image, physical symptoms, and general health in  studies assessing change in quality of life.  PHQ-9: Recent Review Flowsheet Data    There is no flowsheet data to display.     Interpretation of Total Score  Total Score Depression Severity:  1-4 = Minimal depression, 5-9 = Mild depression, 10-14 = Moderate depression, 15-19 = Moderately severe depression, 20-27 = Severe depression   Psychosocial Evaluation and Intervention: Psychosocial Evaluation - 03/04/18 0817      Psychosocial Evaluation & Interventions   Interventions  Encouraged to exercise with the program and follow exercise prescription    Comments  No psychosocial needs identified. No intervention necesary. Pt enjoys going to the movies and watching football and baseball.    Expected Outcomes  Jashawn wil exhibit a postive outlook with good coping skills.    Continue Psychosocial Services   No Follow up required       Psychosocial Re-Evaluation: Psychosocial Re-Evaluation    Row Name 03/23/18 1723             Psychosocial Re-Evaluation   Current issues with  None Identified       Comments  No psychosoical needs identified.        Expected Outcomes  Nikolaj will maintain a positive outlook with good coping skills.        Interventions  Encouraged to attend Cardiac Rehabilitation for the exercise       Continue Psychosocial Services   No Follow up required          Psychosocial Discharge (Final Psychosocial Re-Evaluation): Psychosocial Re-Evaluation - 03/23/18 1723      Psychosocial Re-Evaluation   Current issues with  None  Identified    Comments  No psychosoical needs identified.     Expected Outcomes  Will will maintain a positive outlook with good coping skills.     Interventions  Encouraged to attend Cardiac Rehabilitation for the exercise    Continue Psychosocial Services   No Follow up required       Vocational Rehabilitation: Provide vocational rehab assistance to qualifying candidates.   Vocational Rehab Evaluation &  Intervention:   Education: Education Goals: Education classes will be provided on a weekly basis, covering required topics. Participant will state understanding/return demonstration of topics presented.  Learning Barriers/Preferences: Learning Barriers/Preferences - 02/26/18 0805      Learning Barriers/Preferences   Learning Barriers  Sight    Learning Preferences  Video;Pictoral       Education Topics: Count Your Pulse:  -Group instruction provided by verbal instruction, demonstration, patient participation and written materials to support subject.  Instructors address importance of being able to find your pulse and how to count your pulse when at home without a heart monitor.  Patients get hands on experience counting their pulse with staff help and individually.   Heart Attack, Angina, and Risk Factor Modification:  -Group instruction provided by verbal instruction, video, and written materials to support subject.  Instructors address signs and symptoms of angina and heart attacks.    Also discuss risk factors for heart disease and how to make changes to improve heart health risk factors.   Functional Fitness:  -Group instruction provided by verbal instruction, demonstration, patient participation, and written materials to support subject.  Instructors address safety measures for doing things around the house.  Discuss how to get up and down off the floor, how to pick things up properly, how to safely get out of a chair without assistance, and balance training.   Meditation and Mindfulness:  -Group instruction provided by verbal instruction, patient participation, and written materials to support subject.  Instructor addresses importance of mindfulness and meditation practice to help reduce stress and improve awareness.  Instructor also leads participants through a meditation exercise.    CARDIAC REHAB PHASE II EXERCISE from 03/18/2018 in [facility] CARDIAC REHAB   Date  03/04/18  Educator  Theda Belfast  Instruction Review Code  2- Demonstrated Understanding      Stretching for Flexibility and Mobility:  -Group instruction provided by verbal instruction, patient participation, and written materials to support subject.  Instructors lead participants through series of stretches that are designed to increase flexibility thus improving mobility.  These stretches are additional exercise for major muscle groups that are typically performed during regular warm up and cool down.   Hands Only CPR:  -Group verbal, video, and participation provides a basic overview of AHA guidelines for community CPR. Role-play of emergencies allow participants the opportunity to practice calling for help and chest compression technique with discussion of AED use.   Hypertension: -Group verbal and written instruction that provides a basic overview of hypertension including the most recent diagnostic guidelines, risk factor reduction with self-care instructions and medication management.    Nutrition I class: Heart Healthy Eating:  -Group instruction provided by PowerPoint slides, verbal discussion, and written materials to support subject matter. The instructor gives an explanation and review of the Therapeutic Lifestyle Changes diet recommendations, which includes a discussion on lipid goals, dietary fat, sodium, fiber, plant stanol/sterol esters, sugar, and the components of a well-balanced, healthy diet.   Nutrition II class: Lifestyle Skills:  -Group instruction provided by PowerPoint  slides, verbal discussion, and written materials to support subject matter. The instructor gives an explanation and review of label reading, grocery shopping for heart health, heart healthy recipe modifications, and ways to make healthier choices when eating out.   Diabetes Question & Answer:  -Group instruction provided by PowerPoint slides, verbal discussion, and written materials to  support subject matter. The instructor gives an explanation and review of diabetes co-morbidities, pre- and post-prandial blood glucose goals, pre-exercise blood glucose goals, signs, symptoms, and treatment of hypoglycemia and hyperglycemia, and foot care basics.   Diabetes Blitz:  -Group instruction provided by PowerPoint slides, verbal discussion, and written materials to support subject matter. The instructor gives an explanation and review of the physiology behind type 1 and type 2 diabetes, diabetes medications and rational behind using different medications, pre- and post-prandial blood glucose recommendations and Hemoglobin A1c goals, diabetes diet, and exercise including blood glucose guidelines for exercising safely.    Portion Distortion:  -Group instruction provided by PowerPoint slides, verbal discussion, written materials, and food models to support subject matter. The instructor gives an explanation of serving size versus portion size, changes in portions sizes over the last 20 years, and what consists of a serving from each food group.   Stress Management:  -Group instruction provided by verbal instruction, video, and written materials to support subject matter.  Instructors review role of stress in heart disease and how to cope with stress positively.     CARDIAC REHAB PHASE II EXERCISE from 03/18/2018 in [facility] CARDIAC REHAB  Date  03/11/18  Educator  RN  Instruction Review Code  2- Demonstrated Understanding      Exercising on Your Own:  -Group instruction provided by verbal instruction, power point, and written materials to support subject.  Instructors discuss benefits of exercise, components of exercise, frequency and intensity of exercise, and end points for exercise.  Also discuss use of nitroglycerin and activating EMS.  Review options of places to exercise outside of rehab.  Review guidelines for sex with heart disease.   Cardiac Drugs I:   -Group instruction provided by verbal instruction and written materials to support subject.  Instructor reviews cardiac drug classes: antiplatelets, anticoagulants, beta blockers, and statins.  Instructor discusses reasons, side effects, and lifestyle considerations for each drug class.   Cardiac Drugs II:  -Group instruction provided by verbal instruction and written materials to support subject.  Instructor reviews cardiac drug classes: angiotensin converting enzyme inhibitors (ACE-I), angiotensin II receptor blockers (ARBs), nitrates, and calcium channel blockers.  Instructor discusses reasons, side effects, and lifestyle considerations for each drug class.   Anatomy and Physiology of the Circulatory System:  Group verbal and written instruction and models provide basic cardiac anatomy and physiology, with the coronary electrical and arterial systems. Review of: AMI, Angina, Valve disease, Heart Failure, Peripheral Artery Disease, Cardiac Arrhythmia, Pacemakers, and the ICD.   CARDIAC REHAB PHASE II EXERCISE from 03/18/2018 in [facility] CARDIAC REHAB  Date  03/18/18  Educator  RN  Instruction Review Code  2- Demonstrated Understanding      Other Education:  -Group or individual verbal, written, or video instructions that support the educational goals of the cardiac rehab program.   Holiday Eating Survival Tips:  -Group instruction provided by PowerPoint slides, verbal discussion, and written materials to support subject matter. The instructor gives patients tips, tricks, and techniques to help them not only survive but enjoy the holidays despite the onslaught of food that accompanies the  holidays.   Knowledge Questionnaire Score: Knowledge Questionnaire Score - 02/26/18 0757      Knowledge Questionnaire Score   Pre Score  18/24        Core Components/Risk Factors/Patient Goals at Admission: Personal Goals and Risk Factors at Admission - 02/26/18 1029       Core Components/Risk Factors/Patient Goals on Admission    Weight Management  Yes;Weight Loss    Intervention  Weight Management: Develop a combined nutrition and exercise program designed to reach desired caloric intake, while maintaining appropriate intake of nutrient and fiber, sodium and fats, and appropriate energy expenditure required for the weight goal.;Weight Management: Provide education and appropriate resources to help participant work on and attain dietary goals.;Weight Management/Obesity: Establish reasonable short term and long term weight goals.    Admit Weight  291 lb 0.1 oz (132 kg)    Goal Weight: Short Term  281 lb (127.5 kg)    Goal Weight: Long Term  271 lb (122.9 kg)    Expected Outcomes  Short Term: Continue to assess and modify interventions until short term weight is achieved;Long Term: Adherence to nutrition and physical activity/exercise program aimed toward attainment of established weight goal;Weight Loss: Understanding of general recommendations for a balanced deficit meal plan, which promotes 1-2 lb weight loss per week and includes a negative energy balance of [phone] kcal/d;Understanding of distribution of calorie intake throughout the day with the consumption of 4-5 meals/snacks;Understanding recommendations for meals to include 15-35% energy as protein, 25-35% energy from fat, 35-60% energy from carbohydrates, less than 200mg  of dietary cholesterol, 20-35 gm of total fiber daily    Hypertension  Yes    Intervention  Provide education on lifestyle modifcations including regular physical activity/exercise, weight management, moderate sodium restriction and increased consumption of fresh fruit, vegetables, and low fat dairy, alcohol moderation, and smoking cessation.;Monitor prescription use compliance.    Expected Outcomes  Short Term: Continued assessment and intervention until BP is < 140/71mm HG in hypertensive participants. < 130/26mm HG in hypertensive participants  with diabetes, heart failure or chronic kidney disease.;Long Term: Maintenance of blood pressure at goal levels.    Lipids  Yes    Intervention  Provide education and support for participant on nutrition & aerobic/resistive exercise along with prescribed medications to achieve LDL 70mg , HDL >40mg .    Expected Outcomes  Long Term: Cholesterol controlled with medications as prescribed, with individualized exercise RX and with personalized nutrition plan. Value goals: LDL < 70mg , HDL > 40 mg.;Short Term: Participant states understanding of desired cholesterol values and is compliant with medications prescribed. Participant is following exercise prescription and nutrition guidelines.       Core Components/Risk Factors/Patient Goals Review:  Goals and Risk Factor Review    Row Name 03/04/18 1610 03/23/18 1722           Core Components/Risk Factors/Patient Goals Review   Personal Goals Review  Weight Management/Obesity;Lipids;Hypertension  Weight Management/Obesity;Lipids;Hypertension      Review  Pt with multiple CAD RFs willing to participate in CR exercise. Markell would like to lose weight and learn how to exercise.  Pt with multiple CAD RFs willing to participate in CR exercise. Andon has started exercise recently and is tolerating it well.       Expected Outcomes  Bird will participate in CR exercise, nutrition, and lifestyle modification opportunities.   Matheo will participate in CR exercise, nutrition, and lifestyle modification opportunities.          Core Components/Risk Factors/Patient Goals at  Discharge (Final Review):  Goals and Risk Factor Review - 03/23/18 1722      Core Components/Risk Factors/Patient Goals Review   Personal Goals Review  Weight Management/Obesity;Lipids;Hypertension    Review  Pt with multiple CAD RFs willing to participate in CR exercise. Victorhugo has started exercise recently and is tolerating it well.     Expected Outcomes  Jeral will participate in CR  exercise, nutrition, and lifestyle modification opportunities.        ITP Comments: ITP Comments    Row Name 02/26/18 0756 03/23/18 1721         ITP Comments  Dr. Armanda Magic, Medical Director   30 Day ITP Review.  Jumar is tolerating exercise well. His vitals are stable.          Comments: See ITP Comments.

## 2018-03-27 ENCOUNTER — Encounter (HOSPITAL_COMMUNITY)
Admission: RE | Admit: 2018-03-27 | Discharge: 2018-03-27 | Disposition: A | Discharge status: Home / Self Care | Payer: BLUE CROSS/BLUE SHIELD | Source: Ambulatory Visit | Attending: Internal Medicine | Admitting: Internal Medicine

## 2018-03-27 ENCOUNTER — Encounter (HOSPITAL_COMMUNITY): Payer: BLUE CROSS/BLUE SHIELD

## 2018-03-27 DIAGNOSIS — I214 Non-ST elevation (NSTEMI) myocardial infarction: Secondary | ICD-10-CM

## 2018-03-30 ENCOUNTER — Encounter (HOSPITAL_COMMUNITY)
Admission: RE | Admit: 2018-03-30 | Discharge: 2018-03-30 | Disposition: A | Discharge status: Home / Self Care | Payer: BLUE CROSS/BLUE SHIELD | Source: Ambulatory Visit | Attending: Internal Medicine | Admitting: Internal Medicine

## 2018-03-30 ENCOUNTER — Encounter (HOSPITAL_COMMUNITY): Payer: BLUE CROSS/BLUE SHIELD

## 2018-03-30 DIAGNOSIS — I214 Non-ST elevation (NSTEMI) myocardial infarction: Secondary | ICD-10-CM

## 2018-03-31 ENCOUNTER — Encounter: Payer: Self-pay | Admitting: *Deleted

## 2018-04-01 ENCOUNTER — Encounter (HOSPITAL_COMMUNITY): Payer: BLUE CROSS/BLUE SHIELD

## 2018-04-01 ENCOUNTER — Encounter (HOSPITAL_COMMUNITY)
Admission: RE | Admit: 2018-04-01 | Discharge: 2018-04-01 | Disposition: A | Discharge status: Home / Self Care | Payer: BLUE CROSS/BLUE SHIELD | Source: Ambulatory Visit | Attending: Internal Medicine | Admitting: Internal Medicine

## 2018-04-01 DIAGNOSIS — I214 Non-ST elevation (NSTEMI) myocardial infarction: Secondary | ICD-10-CM

## 2018-04-01 NOTE — Progress Notes (Signed)
Cardiology Office Note:    Date:  04/02/2018   ID:  Michael Webster, DOB 10/30/1955, MRN [id number]  PCP:  Clide DalesWright, Morgan Dionne, PA  Cardiologist:  Norman HerrlichBrian Munley, MD    Referring MD: Clide DalesWright, Morgan Dionne, *    ASSESSMENT:    1. NSTEMI (non-ST elevated myocardial infarction) (HCC)   2. Essential hypertension   3. Other hyperlipidemia    PLAN:    In order of problems listed above:  1. Stable course, regardless of whether his coronary angiography is normally sustained myocardial infarction and I have asked him to resume aspirin coated 81 mg daily.  His resting heart rate is more rapid than typically seen in cardiac rehab because he cannot take beta-blockers with his myasthenia.  He really has done very well he is pleased with the quality of his life he is active vigorous exercises and has had no chest pain shortness of breath palpitation or syncope.  He will continue current medical therapy including oral nitrates calcium channel blocker and his current statin which I think is ideal for man taking immunologic agents for myasthenia gravis. 2. Stable continue current treatment calcium channel blocker 3. Stable continue statin recent lipid profile reviewed his CMP is normal his LDL is at target 92 less than 100 and with normal coronary angiography I would not intensify lipid-lowering therapy his [facility] available in care everywhere   Next appointment: 6 months   Medication Adjustments/Labs and Tests Ordered: Current medicines are reviewed at length with the patient today.  Concerns regarding medicines are outlined above.  Orders Placed This Encounter  Procedures  . EKG 12-Lead   Meds ordered this encounter  Medications  . aspirin EC 81 MG tablet    Sig: Take 1 tablet (81 mg total) by mouth daily.    Dispense:  90 tablet    Refill:  3    Chief Complaint  Patient presents with  . New Patient (Initial Visit)  . Coronary Artery Disease  .  Hypertension  . Hyperlipidemia    History of Present Illness:    Michael Webster is a 62 y.o. male with a hx of non STEMI 01/05/18 with normal coronary arteriography . Echo showed EF 60-65% moderate LAE.He was  last seen 01/15/18. Compliance with diet, lifestyle and medications: Yes  He participates enthusiastically in cardiac rehabilitation is quite pleased with the quality of his life he has no muscle weakness chest pain shortness of breath palpitation or syncope.  His resting heart rates tend to be in the range of 90 bpm influenced by the fact that he cannot take a beta-blocker.  His heart rate response is normal during exercise and he has not had to break his activities because of excessive heart rate.  He is compliant with diet. Past Medical History:  Diagnosis Date  . Congestive heart failure (CHF) (HCC)   . Essential hypertension 08/08/2016  . Gastroesophageal reflux disease 02/24/2018  . GI bleed 07/18/2016  . Hypertension   . Myasthenia gravis (HCC)   . NSTEMI (non-ST elevated myocardial infarction) (HCC) 01/03/2018  . Other hyperlipidemia 05/01/2017  . Snoring 03/12/2018  . Stroke [facility](HCC)     Past Surgical History:  Procedure Laterality Date  . LAPAROSCOPIC CHOLECYSTECTOMY    . LEFT HEART CATH AND CORONARY ANGIOGRAPHY N/A 01/05/2018   Procedure: LEFT HEART CATH AND CORONARY ANGIOGRAPHY;  Surgeon: Lennette BihariKelly, Taurean A, MD;  Location: [facility] INVASIVE CV LAB;  Service: Cardiovascular;  Laterality: N/A;  Current Medications: Current Meds  Medication Sig  . amLODipine (NORVASC) 5 MG tablet Take 1 tablet (5 mg total) by mouth daily.  Marland Kitchen. docusate sodium (COLACE) 100 MG capsule Take 100 mg by mouth daily as needed for mild constipation.  . famotidine (PEPCID) 20 MG tablet Take 20 mg by mouth 2 (two) times daily.  . hydrochlorothiazide (HYDRODIURIL) 25 MG tablet Take 1 tablet (25 mg total) by mouth daily.  . isosorbide mononitrate (IMDUR) 30 MG 24 hr tablet Take 1 tablet (30 mg total) by mouth  daily.  Marland Kitchen. ketotifen (ZADITOR) 0.025 % ophthalmic solution Place 1 drop into both eyes daily as needed.  . mycophenolate (CELLCEPT) 500 MG tablet Take 500 mg by mouth 2 (two) times daily.  . nitroGLYCERIN (NITROSTAT) 0.4 MG SL tablet Place 1 tablet (0.4 mg total) under the tongue every 5 (five) minutes as needed for chest pain.  . pravastatin (PRAVACHOL) 40 MG tablet Take 40 mg by mouth every evening.  . pyridostigmine (MESTINON) 60 MG tablet Take 60 mg by mouth 4 (four) times daily.  Marland Kitchen. senna (SENOKOT) 8.6 MG TABS tablet Take 1 tablet by mouth daily as needed for mild constipation.     Allergies:   Acetaminophen; Aminoglycosides; Beta adrenergic blockers; Calcium channel blockers; Magnesium-containing compounds; Naproxen; Penicillamine; Quinine derivatives; Tubocurarine; Duexis [ibuprofen-famotidine]; Erythromycin; Maalox  [calcium carbonate antacid]; and Other   Social History   Socioeconomic History  . Marital status: Widowed    Spouse name: Not on file  . Number of children: Not on file  . Years of education: Not on file  . Highest education level: Not on file  Occupational History  . Not on file  Social Needs  . Financial resource strain: Not on file  . Food insecurity:    Worry: Not on file    Inability: Not on file  . Transportation needs:    Medical: Not on file    Non-medical: Not on file  Tobacco Use  . Smoking status: Never Smoker  . Smokeless tobacco: Never Used  Substance and Sexual Activity  . Alcohol use: Yes    Comment: Occasional use  . Drug use: Never  . Sexual activity: Not on file  Lifestyle  . Physical activity:    Days per week: Not on file    Minutes per session: Not on file  . Stress: Not on file  Relationships  . Social connections:    Talks on phone: Not on file    Gets together: Not on file    Attends religious service: Not on file    Active member of club or organization: Not on file    Attends meetings of clubs or organizations: Not on file      Relationship status: Not on file  Other Topics Concern  . Not on file  Social History Narrative  . Not on file     Family History: The patient's family history includes Breast cancer in his mother; Colon cancer in his father and paternal grandfather; Liver disease in his brother; Stroke in his father. There is no history of Heart attack. ROS:   Please see the history of present illness.    All other systems reviewed and are negative.  EKGs/Labs/Other Studies Reviewed:    The following studies were reviewed today:  EKG:  EKG ordered today.  The ekg ordered today [facility] NSIV[facility] and LBBB  Recent Labs: 01/03/2018: B Natriuretic Peptide 13.2 01/06/2018: BUN 12; Creatinine, Ser 1.16; Hemoglobin 14.8; Platelets 186; Potassium 4.1;  Sodium 141  Recent Lipid Panel    Component Value Date/Time   CHOL 172 01/04/2018 0650   TRIG 122 01/04/2018 0650   HDL 54 01/04/2018 0650   CHOLHDL 3.2 01/04/2018 0650   VLDL 24 01/04/2018 0650   LDLCALC 94 01/04/2018 0650    Physical Exam:    VS:  BP 132/90 (BP Location: Right Arm, Patient Position: Sitting, Cuff Size: Large)   Pulse 83   Ht 5\' 10"  (1.778 m)   Wt 279 lb 6.4 oz (126.7 kg)   SpO2 98%   BMI 40.09 kg/m     Wt Readings from Last 3 Encounters:  04/02/18 279 lb 6.4 oz (126.7 kg)  02/26/18 291 lb 0.1 oz (132 kg)  01/15/18 286 lb 12.8 oz (130.1 kg)     GEN:  Well nourished, well developed in no acute distress HEENT: Normal NECK: No JVD; No carotid bruits LYMPHATICS: No lymphadenopathy CARDIAC: RRR, no murmurs, rubs, gallops RESPIRATORY:  Clear to auscultation without rales, wheezing or rhonchi  ABDOMEN: Soft, non-tender, non-distended MUSCULOSKELETAL:  No edema; No deformity  SKIN: Warm and dry NEUROLOGIC:  Alert and oriented x 3 PSYCHIATRIC:  Normal affect    Signed, Norman Herrlich, MD  04/02/2018 11:42 AM    [facility]

## 2018-04-02 ENCOUNTER — Encounter: Payer: Self-pay | Admitting: Cardiology

## 2018-04-02 ENCOUNTER — Ambulatory Visit (INDEPENDENT_AMBULATORY_CARE_PROVIDER_SITE_OTHER): Payer: Self-pay | Admitting: Cardiology

## 2018-04-02 VITALS — BP 132/90 | HR 83 | Ht 70.0 in | Wt 279.4 lb

## 2018-04-02 DIAGNOSIS — E7849 Other hyperlipidemia: Secondary | ICD-10-CM

## 2018-04-02 DIAGNOSIS — I10 Essential (primary) hypertension: Secondary | ICD-10-CM

## 2018-04-02 DIAGNOSIS — I214 Non-ST elevation (NSTEMI) myocardial infarction: Secondary | ICD-10-CM

## 2018-04-02 MED ORDER — ASPIRIN EC 81 MG PO TBEC: 81.0000 mg | DELAYED_RELEASE_TABLET | Freq: Every day | ORAL | 3 refills | Status: AC

## 2018-04-02 NOTE — Patient Instructions (Signed)
Medication Instructions:  Your physician has recommended you make the following change in your medication:  START aspirin 81 mg daily: samples provided  Labwork: None  Testing/Procedures: You had an EKG today.   Follow-Up: Your physician wants you to follow-up in: 6 months. You will receive a reminder letter in the mail two months in advance. If you don't receive a letter, please call our office to schedule the follow-up appointment.   If you need a refill on your cardiac medications before your next appointment, please call your pharmacy.   Thank you for choosing [facility]! Mady GemmaCatherine Lockhart, RN [phone]    Aspirin and Your Heart Aspirin is a medicine that affects the way blood clots. Aspirin can be used to help reduce the risk of blood clots, heart attacks, and other heart-related problems. Should I take aspirin? Your health care provider will help you determine whether it is safe and beneficial for you to take aspirin daily. Taking aspirin daily may be beneficial if you:  Have had a heart attack or chest pain.  Have undergone open heart surgery such as coronary artery bypass surgery (CABG).  Have had coronary angioplasty.  Have experienced a stroke or transient ischemic attack (TIA).  Have peripheral vascular disease (PVD).  Have chronic heart rhythm problems such as atrial fibrillation.  Are there any risks of taking aspirin daily? Daily use of aspirin can increase your risk of side effects. Some of these include:  Bleeding. Bleeding problems can be minor or serious. An example of a minor problem is a cut that does not stop bleeding. An example of a more serious problem is stomach bleeding or bleeding into the brain. Your risk of bleeding is increased if you are also taking non-steroidal anti-inflammatory medicine (NSAIDs).  Increased bruising.  Upset stomach.  An allergic reaction. People who have nasal polyps have an increased risk of developing an  aspirin allergy.  What are some guidelines I should follow when taking aspirin?  Take aspirin only as directed by your health care provider. Make sure you understand how much you should take and what form you should take. The two forms of aspirin are: ? Non-enteric-coated. This type of aspirin does not have a coating and is absorbed quickly. Non-enteric-coated aspirin is usually recommended for people with chest pain. This type of aspirin also comes in a chewable form. ? Enteric-coated. This type of aspirin has a special coating that releases the medicine very slowly. Enteric-coated aspirin causes less stomach upset than non-enteric-coated aspirin. This type of aspirin should not be chewed or crushed.  Drink alcohol in moderation. Drinking alcohol increases your risk of bleeding. When should I seek medical care?  You have unusual bleeding or bruising.  You have stomach pain.  You have an allergic reaction. Symptoms of an allergic reaction include: ? Hives. ? Itchy skin. ? Swelling of the lips, tongue, or face.  You have ringing in your ears. When should I seek immediate medical care?  Your bowel movements are bloody, dark red, or black in color.  You vomit or cough up blood.  You have blood in your urine.  You cough, wheeze, or feel short of breath. If you have any of the following symptoms, this is an emergency. Do not wait to see if the pain will go away. Get medical help at once. Call your local emergency services (911 in the [place] Do not drive yourself to the hospital.  You have severe chest pain, especially if the pain is crushing or  pressure-like and spreads to the arms, back, neck, or jaw.  You have stroke-like symptoms, such as: ? Loss of vision. ? Difficulty talking. ? Numbness or weakness on one side of your body. ? Numbness or weakness in your arm or leg. ? Not thinking clearly or feeling confused.  This information is not intended to replace advice given to you  by your health care provider. Make sure you discuss any questions you have with your health care provider. Document Released: 07/18/2008 Document Revised: 12/13/2015 Document Reviewed: 11/10/2013 Elsevier Interactive Patient Education  2018 [employer]

## 2018-04-03 ENCOUNTER — Encounter (HOSPITAL_COMMUNITY)
Admission: RE | Admit: 2018-04-03 | Discharge: 2018-04-03 | Disposition: A | Discharge status: Home / Self Care | Payer: BLUE CROSS/BLUE SHIELD | Source: Ambulatory Visit | Attending: Internal Medicine | Admitting: Internal Medicine

## 2018-04-03 ENCOUNTER — Encounter (HOSPITAL_COMMUNITY): Payer: BLUE CROSS/BLUE SHIELD

## 2018-04-03 DIAGNOSIS — I214 Non-ST elevation (NSTEMI) myocardial infarction: Secondary | ICD-10-CM

## 2018-04-06 ENCOUNTER — Encounter (HOSPITAL_COMMUNITY)
Admission: RE | Admit: 2018-04-06 | Discharge: 2018-04-06 | Disposition: A | Discharge status: Home / Self Care | Payer: BLUE CROSS/BLUE SHIELD | Source: Ambulatory Visit | Attending: Internal Medicine | Admitting: Internal Medicine

## 2018-04-06 ENCOUNTER — Encounter (HOSPITAL_COMMUNITY): Payer: BLUE CROSS/BLUE SHIELD

## 2018-04-06 DIAGNOSIS — I214 Non-ST elevation (NSTEMI) myocardial infarction: Secondary | ICD-10-CM

## 2018-04-08 ENCOUNTER — Encounter (HOSPITAL_COMMUNITY): Payer: BLUE CROSS/BLUE SHIELD

## 2018-04-08 ENCOUNTER — Encounter (HOSPITAL_COMMUNITY)
Admission: RE | Admit: 2018-04-08 | Discharge: 2018-04-08 | Disposition: A | Discharge status: Home / Self Care | Payer: BLUE CROSS/BLUE SHIELD | Source: Ambulatory Visit | Attending: Internal Medicine | Admitting: Internal Medicine

## 2018-04-08 DIAGNOSIS — I214 Non-ST elevation (NSTEMI) myocardial infarction: Secondary | ICD-10-CM

## 2018-04-10 ENCOUNTER — Encounter (HOSPITAL_COMMUNITY): Payer: BLUE CROSS/BLUE SHIELD

## 2018-04-10 ENCOUNTER — Encounter (HOSPITAL_COMMUNITY)
Admission: RE | Admit: 2018-04-10 | Discharge: 2018-04-10 | Disposition: A | Discharge status: Home / Self Care | Payer: BLUE CROSS/BLUE SHIELD | Source: Ambulatory Visit | Attending: Internal Medicine | Admitting: Internal Medicine

## 2018-04-10 DIAGNOSIS — I214 Non-ST elevation (NSTEMI) myocardial infarction: Secondary | ICD-10-CM

## 2018-04-13 ENCOUNTER — Encounter (HOSPITAL_COMMUNITY): Payer: BLUE CROSS/BLUE SHIELD

## 2018-04-13 ENCOUNTER — Encounter (HOSPITAL_COMMUNITY)
Admission: RE | Admit: 2018-04-13 | Discharge: 2018-04-13 | Disposition: A | Discharge status: Home / Self Care | Payer: BLUE CROSS/BLUE SHIELD | Source: Ambulatory Visit | Attending: Internal Medicine | Admitting: Internal Medicine

## 2018-04-13 DIAGNOSIS — I214 Non-ST elevation (NSTEMI) myocardial infarction: Secondary | ICD-10-CM

## 2018-04-15 ENCOUNTER — Encounter (HOSPITAL_COMMUNITY): Payer: BLUE CROSS/BLUE SHIELD

## 2018-04-15 ENCOUNTER — Encounter (HOSPITAL_COMMUNITY)
Admission: RE | Admit: 2018-04-15 | Discharge: 2018-04-15 | Disposition: A | Discharge status: Home / Self Care | Payer: BLUE CROSS/BLUE SHIELD | Source: Ambulatory Visit | Attending: Internal Medicine | Admitting: Internal Medicine

## 2018-04-15 DIAGNOSIS — I214 Non-ST elevation (NSTEMI) myocardial infarction: Secondary | ICD-10-CM

## 2018-04-17 ENCOUNTER — Encounter (HOSPITAL_COMMUNITY): Payer: BLUE CROSS/BLUE SHIELD

## 2018-04-17 ENCOUNTER — Encounter (HOSPITAL_COMMUNITY)
Admission: RE | Admit: 2018-04-17 | Discharge: 2018-04-17 | Disposition: A | Discharge status: Home / Self Care | Payer: BLUE CROSS/BLUE SHIELD | Source: Ambulatory Visit | Attending: Internal Medicine | Admitting: Internal Medicine

## 2018-04-17 ENCOUNTER — Encounter (HOSPITAL_COMMUNITY): Payer: Self-pay

## 2018-04-17 DIAGNOSIS — I214 Non-ST elevation (NSTEMI) myocardial infarction: Secondary | ICD-10-CM

## 2018-04-21 ENCOUNTER — Ambulatory Visit: Payer: Self-pay | Admitting: Physician Assistant

## 2018-04-22 ENCOUNTER — Encounter (HOSPITAL_COMMUNITY): Payer: BLUE CROSS/BLUE SHIELD

## 2018-04-22 ENCOUNTER — Encounter (HOSPITAL_COMMUNITY)
Admission: RE | Admit: 2018-04-22 | Discharge: 2018-04-22 | Disposition: A | Discharge status: Home / Self Care | Payer: BLUE CROSS/BLUE SHIELD | Source: Ambulatory Visit | Attending: Internal Medicine | Admitting: Internal Medicine

## 2018-04-22 DIAGNOSIS — I214 Non-ST elevation (NSTEMI) myocardial infarction: Secondary | ICD-10-CM | POA: Insufficient documentation

## 2018-04-22 DIAGNOSIS — I509 Heart failure, unspecified: Secondary | ICD-10-CM | POA: Insufficient documentation

## 2018-04-22 DIAGNOSIS — Z8673 Personal history of transient ischemic attack (TIA), and cerebral infarction without residual deficits: Secondary | ICD-10-CM | POA: Insufficient documentation

## 2018-04-22 DIAGNOSIS — Z79899 Other long term (current) drug therapy: Secondary | ICD-10-CM | POA: Insufficient documentation

## 2018-04-22 DIAGNOSIS — G7000 Myasthenia gravis without (acute) exacerbation: Secondary | ICD-10-CM | POA: Insufficient documentation

## 2018-04-22 DIAGNOSIS — I110 Hypertensive heart disease with heart failure: Secondary | ICD-10-CM | POA: Insufficient documentation

## 2018-04-23 NOTE — Progress Notes (Signed)
Pt presents with a financial assist letter stating "A 100% discount will be applied to patient balances for eligible services."  [facility] Customer Service called to inquire if outpatient cardiac rehab was included as an eligible service.  Per Customer Service Representative, outpatient cardiac rehab is included.  Pt will receive a billing statement initially to be made aware of cost but it will be covered per the representative.

## 2018-04-23 NOTE — Progress Notes (Signed)
Cardiac Individual Treatment Plan  Patient Details  Name: Michael Webster MRN: [id number] Date of Birth: November 22, 1955 Referring Provider:     CARDIAC REHAB PHASE II ORIENTATION from 02/26/2018 in [facility] CARDIAC REHAB  Referring Provider  Duke Salvia MD       Initial Encounter Date:    CARDIAC REHAB PHASE II ORIENTATION from 02/26/2018 in [facility] CARDIAC REHAB  Date  02/26/18      Visit Diagnosis: NSTEMI (non-ST elevated myocardial infarction) [facility])  Patient's Home Medications on Admission:  Current Outpatient Medications:  .  amLODipine (NORVASC) 5 MG tablet, Take 1 tablet (5 mg total) by mouth daily., Disp: 90 tablet, Rfl: 3 .  aspirin EC 81 MG tablet, Take 1 tablet (81 mg total) by mouth daily., Disp: 90 tablet, Rfl: 3 .  docusate sodium (COLACE) 100 MG capsule, Take 100 mg by mouth daily as needed for mild constipation., Disp: , Rfl:  .  famotidine (PEPCID) 20 MG tablet, Take 20 mg by mouth 2 (two) times daily., Disp: , Rfl:  .  hydrochlorothiazide (HYDRODIURIL) 25 MG tablet, Take 1 tablet (25 mg total) by mouth daily., Disp: 90 tablet, Rfl: 3 .  isosorbide mononitrate (IMDUR) 30 MG 24 hr tablet, Take 1 tablet (30 mg total) by mouth daily., Disp: 90 tablet, Rfl: 3 .  ketotifen (ZADITOR) 0.025 % ophthalmic solution, Place 1 drop into both eyes daily as needed., Disp: , Rfl:  .  mycophenolate (CELLCEPT) 500 MG tablet, Take 500 mg by mouth 2 (two) times daily., Disp: , Rfl:  .  nitroGLYCERIN (NITROSTAT) 0.4 MG SL tablet, Place 1 tablet (0.4 mg total) under the tongue every 5 (five) minutes as needed for chest pain., Disp: 30 tablet, Rfl: 0 .  pravastatin (PRAVACHOL) 40 MG tablet, Take 40 mg by mouth every evening., Disp: , Rfl:  .  pyridostigmine (MESTINON) 60 MG tablet, Take 60 mg by mouth 4 (four) times daily., Disp: , Rfl:  .  senna (SENOKOT) 8.6 MG TABS tablet, Take 1 tablet by mouth daily as needed for mild constipation., Disp: , Rfl:    Past Medical History: Past Medical History:  Diagnosis Date  . Congestive heart failure (CHF) (HCC)   . Essential hypertension 08/08/2016  . Gastroesophageal reflux disease 02/24/2018  . GI bleed 07/18/2016  . Hypertension   . Myasthenia gravis (HCC)   . NSTEMI (non-ST elevated myocardial infarction) (HCC) 01/03/2018  . Other hyperlipidemia 05/01/2017  . Snoring 03/12/2018  . Stroke (HCC)     Tobacco Use: Social History   Tobacco Use  Smoking Status Never Smoker  Smokeless Tobacco Never Used    Labs: Recent Review Advice worker    Labs for ITP Cardiac and Pulmonary Rehab Latest Ref Rng & Units 01/04/2018   Cholestrol 0 - 200 mg/dL 829   LDLCALC 0 - 99 mg/dL 94   HDL >56 mg/dL 54   Trlycerides <213 mg/dL 086   Hemoglobin V7Q 4.8 - 5.6 % 5.3      Capillary Blood Glucose: No results found for: GLUCAP   Exercise Target Goals: Exercise Program Goal: Individual exercise prescription set using results from initial 6 min walk test and THRR while considering  patient's activity barriers and safety.   Exercise Prescription Goal: Initial exercise prescription builds to 30-45 minutes a day of aerobic activity, 2-3 days per week.  Home exercise guidelines will be given to patient during program as part of exercise prescription that the participant will acknowledge.  Activity Barriers &  Risk Stratification: Activity Barriers & Cardiac Risk Stratification - 02/26/18 1110      Activity Barriers & Cardiac Risk Stratification   Activity Barriers  Muscular Weakness;Neck/Spine Problems;Arthritis    Comments  Myasthenia Gravis    Cardiac Risk Stratification  High       6 Minute Walk: 6 Minute Walk    Row Name 02/26/18 1038         6 Minute Walk   Phase  Initial     Distance  1522 feet     Walk Time  6 minutes     # of Rest Breaks  0     MPH  2.88     METS  2.97     RPE  11     Perceived Dyspnea   0     VO2 Peak  10.41     Symptoms  No     Resting HR  86 bpm      Resting BP  142/88     Resting Oxygen Saturation   97 %     Exercise Oxygen Saturation  during 6 min walk  96 %     Max Ex. HR  113 bpm     Max Ex. BP  150/82     2 Minute Post BP  130/80        Oxygen Initial Assessment:   Oxygen Re-Evaluation:   Oxygen Discharge (Final Oxygen Re-Evaluation):   Initial Exercise Prescription: Initial Exercise Prescription - 02/26/18 1000      Date of Initial Exercise RX and Referring Provider   Date  02/26/18    Referring Provider  Duke Salvia MD     Expected Discharge Date  05/31/18      Treadmill   MPH  2    Grade  1    Minutes  10    METs  2.81      Bike   Level  0.9    Minutes  10    METs  2.31      NuStep   Level  2    SPM  75    Minutes  10    METs  2.5      Prescription Details   Frequency (times per week)  3x    Duration  Progress to 30 minutes of continuous aerobic without signs/symptoms of physical distress      Intensity   THRR 40-80% of Max Heartrate  63-126    Ratings of Perceived Exertion  11-13    Perceived Dyspnea  0-4      Progression   Progression  Continue progressive overload as per policy without signs/symptoms or physical distress.      Resistance Training   Training Prescription  Yes    Weight  4lbs    Reps  10-15       Perform Capillary Blood Glucose checks as needed.  Exercise Prescription Changes: Exercise Prescription Changes    Row Name 03/04/18 0900 03/25/18 1500 04/08/18 1539 04/17/18 1705       Response to Exercise   Blood Pressure (Admit)  136/80  128/80  134/80  124/80    Blood Pressure (Exercise)  164/90  160/64  158/80  150/74    Blood Pressure (Exit)  150/84  130/80  122/70  120/84    Heart Rate (Admit)  93 bpm  90 bpm  74 bpm  83 bpm    Heart Rate (Exercise)  145 bpm  112 bpm  128 bpm  141 bpm    Heart Rate (Exit)  97 bpm  77 bpm  76 bpm  88 bpm    Rating of Perceived Exertion (Exercise)  13  11  12  12     Perceived Dyspnea (Exercise)  0  0  0  0    Symptoms  None    None   None   None     Comments  Pt oriented to exercise equipment   -  None   -    Duration  Progress to 30 minutes of  aerobic without signs/symptoms of physical distress  Continue with 30 min of aerobic exercise without signs/symptoms of physical distress.  -  Continue with 30 min of aerobic exercise without signs/symptoms of physical distress.    Intensity  THRR New  THRR unchanged  -  THRR unchanged      Progression   Progression  Continue to progress workloads to maintain intensity without signs/symptoms of physical distress.  Continue to progress workloads to maintain intensity without signs/symptoms of physical distress.  -  Continue to progress workloads to maintain intensity without signs/symptoms of physical distress.    Average METs  2.3  2.68  3.36  3.41      Resistance Training   Training Prescription  No  No  No  Yes    Weight  -  -  -  5lbs    Reps  -  -  -  10-15    Time  -  -  -  10 Minutes      Interval Training   Interval Training  No  No  No  No      Treadmill   MPH  2  2  3.4  2.4    Grade  1  1  3  3     Minutes  10  10  10  10     METs  2.81  2.81  3.83  3.83      Bike   Level  0.9  1.2  1.2  1.2    Minutes  10  10  10  10     METs  2.29  2.75  2.76  2.81      NuStep   Level  2  3  5  5     SPM  75  95  100  100    Minutes  10  10  10  10     METs  1.8  2.5  3.5  3.5      Home Exercise Plan   Plans to continue exercise at  -  Home (comment) Walking  Home (comment) Walking  Home (comment) Walking    Frequency  -  Add 2 additional days to program exercise sessions.  Add 2 additional days to program exercise sessions.  Add 2 additional days to program exercise sessions.    Initial Home Exercises Provided  -  03/18/18  03/18/18  03/18/18       Exercise Comments: Exercise Comments    Row Name 03/04/18 4098 03/25/18 1448 04/22/18 1709       Exercise Comments  Pt's first day of exercise. Pt oriented to exercise equipment. Will continue to monitor and  progress pt.   Pt is responding well to exercise prescription. Pt is currently walking 4 days, 30 minutes at home. Will continue to monitor and progress pt as tolerated.   Reviewed METs and Goals with pt. Pt is making great progress in Cardiac Rehab. Pt  is exercising on his own and enjoying rehab. Pt is enjoying rehab so much, he has decided to volunteer for the department.         Exercise Goals and Review: Exercise Goals    Row Name 02/26/18 0805             Exercise Goals   Increase Physical Activity  Yes       Intervention  Provide advice, education, support and counseling about physical activity/exercise needs.;Develop an individualized exercise prescription for aerobic and resistive training based on initial evaluation findings, risk stratification, comorbidities and participant's personal goals.       Expected Outcomes  Short Term: Attend rehab on a regular basis to increase amount of physical activity.;Long Term: Add in home exercise to make exercise part of routine and to increase amount of physical activity.;Long Term: Exercising regularly at least 3-5 days a week.       Increase Strength and Stamina  Yes       Intervention  Develop an individualized exercise prescription for aerobic and resistive training based on initial evaluation findings, risk stratification, comorbidities and participant's personal goals.;Provide advice, education, support and counseling about physical activity/exercise needs.       Expected Outcomes  Short Term: Increase workloads from initial exercise prescription for resistance, speed, and METs.;Short Term: Perform resistance training exercises routinely during rehab and add in resistance training at home;Long Term: Improve cardiorespiratory fitness, muscular endurance and strength as measured by increased METs and functional capacity ( )       Able to understand and use rate of perceived exertion (RPE) scale  Yes       Intervention  Provide education and  explanation on how to use RPE scale       Expected Outcomes  Short Term: Able to use RPE daily in rehab to express subjective intensity level;Long Term:  Able to use RPE to guide intensity level when exercising independently       Knowledge and understanding of Target Heart Rate Range (THRR)  Yes       Intervention  Provide education and explanation of THRR including how the numbers were predicted and where they are located for reference       Expected Outcomes  Long Term: Able to use THRR to govern intensity when exercising independently;Short Term: Able to state/look up THRR;Short Term: Able to use daily as guideline for intensity in rehab       Able to check pulse independently  Yes       Intervention  Provide education and demonstration on how to check pulse in carotid and radial arteries.;Review the importance of being able to check your own pulse for safety during independent exercise       Expected Outcomes  Short Term: Able to explain why pulse checking is important during independent exercise;Long Term: Able to check pulse independently and accurately       Understanding of Exercise Prescription  Yes       Intervention  Provide education, explanation, and written materials on patient's individual exercise prescription       Expected Outcomes  Short Term: Able to explain program exercise prescription;Long Term: Able to explain home exercise prescription to exercise independently          Exercise Goals Re-Evaluation : Exercise Goals Re-Evaluation    Row Name 03/18/18 0727 04/22/18 1712           Exercise Goal Re-Evaluation   Exercise Goals Review  Understanding of Exercise Prescription;Knowledge  and understanding of Target Heart Rate Range (THRR);Able to understand and use rate of perceived exertion (RPE) scale  Increase Physical Activity;Understanding of Exercise Prescription      Comments  Reviewed home exercise guidelines with patient including THRR, RPE scale, and endpoints for  exercise. Pt is currently walking 30 minutes on T,Th, Sat, and Sun at the mall or [place] as his mode of home exercise. Pt is thinking about joining [employer].  Pt has goal to decrease weight and pt is doing a great job with losing weight. Pt is exercising with no difficulty. Will follow up with pt regarding increasing handweights to 6lbs.       Expected Outcomes  Patient will continue walking 4 days/week in addition to exercie at cardiac rehab to help achieve personal health and fitness goals.  Pt will continue to exercise 5-6 days a week at [employer] and Cardiac Rehab. Pt will continue to increase strength and stamina. Will continue to monitor and progress pt as tolerated.          Discharge Exercise Prescription (Final Exercise Prescription Changes): Exercise Prescription Changes - 04/17/18 1705      Response to Exercise   Blood Pressure (Admit)  124/80    Blood Pressure (Exercise)  150/74    Blood Pressure (Exit)  120/84    Heart Rate (Admit)  83 bpm    Heart Rate (Exercise)  141 bpm    Heart Rate (Exit)  88 bpm    Rating of Perceived Exertion (Exercise)  12    Perceived Dyspnea (Exercise)  0    Symptoms  None     Duration  Continue with 30 min of aerobic exercise without signs/symptoms of physical distress.    Intensity  THRR unchanged      Progression   Progression  Continue to progress workloads to maintain intensity without signs/symptoms of physical distress.    Average METs  3.41      Resistance Training   Training Prescription  Yes    Weight  5lbs    Reps  10-15    Time  10 Minutes      Interval Training   Interval Training  No      Treadmill   MPH  2.4    Grade  3    Minutes  10    METs  3.83      Bike   Level  1.2    Minutes  10    METs  2.81      NuStep   Level  5    SPM  100    Minutes  10    METs  3.5      Home Exercise Plan   Plans to continue exercise at  Home (comment)   Walking   Frequency  Add 2 additional days to program exercise  sessions.    Initial Home Exercises Provided  03/18/18       Nutrition:  Target Goals: Understanding of nutrition guidelines, daily intake of sodium 1500mg , cholesterol 200mg , calories 30% from fat and 7% or less from saturated fats, daily to have 5 or more servings of fruits and vegetables.  Biometrics: Pre Biometrics - 02/26/18 1044      Pre Biometrics   Height  5\' 8"  (1.727 m)    Weight  132 kg    Waist Circumference  54.5 inches    Hip Circumference  49.75 inches    Waist to Hip Ratio  1.1 %  BMI (Calculated)  44.26    Triceps Skinfold  27 mm    % Body Fat  43 %    Grip Strength  44 kg    Flexibility  12.5 in    Single Leg Stand  25.49 seconds        Nutrition Therapy Plan and Nutrition Goals: Nutrition Therapy & Goals - 02/26/18 1213      Nutrition Therapy   Diet  heart healthy      Personal Nutrition Goals   Nutrition Goal  Pt to identify food quantities necessary to achieve weight loss of 6-24 lbs. at graduation from cardiac rehab.    Personal Goal #2  Pt to identify and limit food sources of saturated fat, trans fat, and sodium      Intervention Plan   Intervention  Prescribe, educate and counsel regarding individualized specific dietary modifications aiming towards targeted core components such as weight, hypertension, lipid management, diabetes, heart failure and other comorbidities.    Expected Outcomes  Short Term Goal: Understand basic principles of dietary content, such as calories, fat, sodium, cholesterol and nutrients.       Nutrition Assessments: Nutrition Assessments - 02/26/18 1214      MEDFICTS Scores   Pre Score  30       Nutrition Goals Re-Evaluation: Nutrition Goals Re-Evaluation    Row Name 02/26/18 1213             Goals   Current Weight  291 lb 0.1 oz (132 kg)          Nutrition Goals Re-Evaluation: Nutrition Goals Re-Evaluation    Row Name 02/26/18 1213             Goals   Current Weight  291 lb 0.1 oz (132 kg)           Nutrition Goals Discharge (Final Nutrition Goals Re-Evaluation): Nutrition Goals Re-Evaluation - 02/26/18 1213      Goals   Current Weight  291 lb 0.1 oz (132 kg)       Psychosocial: Target Goals: Acknowledge presence or absence of significant depression and/or stress, maximize coping skills, provide positive support system. Participant is able to verbalize types and ability to use techniques and skills needed for reducing stress and depression.  Initial Review & Psychosocial Screening: Initial Psych Review & Screening - 02/26/18 0757      Initial Review   Current issues with  None Identified      Family Dynamics   Good Support System?  Yes   family love and support      Barriers   Psychosocial barriers to participate in program  There are no identifiable barriers or psychosocial needs.      Screening Interventions   Interventions  Encouraged to exercise       Quality of Life Scores: Quality of Life - 02/26/18 0906      Quality of Life   Select  Quality of Life      Quality of Life Scores   Health/Function Pre  28.6 %    Socioeconomic Pre  25.6 %    Psych/Spiritual Pre  30 %    Family Pre  26.4 %    GLOBAL Pre  27.9 %      Scores of 19 and below usually indicate a poorer quality of life in these areas.  A difference of  2-3 points is a clinically meaningful difference.  A difference of 2-3 points in the total score of the Quality of  Life Index has been associated with significant improvement in overall quality of life, self-image, physical symptoms, and general health in studies assessing change in quality of life.  PHQ-9: Recent Review Flowsheet Data    There is no flowsheet data to display.     Interpretation of Total Score  Total Score Depression Severity:  1-4 = Minimal depression, 5-9 = Mild depression, 10-14 = Moderate depression, 15-19 = Moderately severe depression, 20-27 = Severe depression   Psychosocial Evaluation and  Intervention: Psychosocial Evaluation - 03/04/18 0817      Psychosocial Evaluation & Interventions   Interventions  Encouraged to exercise with the program and follow exercise prescription    Comments  No psychosocial needs identified. No intervention necesary. Pt enjoys going to the movies and watching football and baseball.    Expected Outcomes  Jemery wil exhibit a postive outlook with good coping skills.    Continue Psychosocial Services   No Follow up required       Psychosocial Re-Evaluation: Psychosocial Re-Evaluation    Row Name 03/23/18 1723 04/17/18 1619           Psychosocial Re-Evaluation   Current issues with  None Identified  None Identified      Comments  No psychosoical needs identified.   No psychosoical needs identified.       Expected Outcomes  Phil will maintain a positive outlook with good coping skills.   Trason will maintain a positive outlook with good coping skills.       Interventions  Encouraged to attend Cardiac Rehabilitation for the exercise  Encouraged to attend Cardiac Rehabilitation for the exercise      Continue Psychosocial Services   No Follow up required  No Follow up required         Psychosocial Discharge (Final Psychosocial Re-Evaluation): Psychosocial Re-Evaluation - 04/17/18 1619      Psychosocial Re-Evaluation   Current issues with  None Identified    Comments  No psychosoical needs identified.     Expected Outcomes  Yohan will maintain a positive outlook with good coping skills.     Interventions  Encouraged to attend Cardiac Rehabilitation for the exercise    Continue Psychosocial Services   No Follow up required       Vocational Rehabilitation: Provide vocational rehab assistance to qualifying candidates.   Vocational Rehab Evaluation & Intervention:   Education: Education Goals: Education classes will be provided on a weekly basis, covering required topics. Participant will state understanding/return demonstration of  topics presented.  Learning Barriers/Preferences: Learning Barriers/Preferences - 02/26/18 0805      Learning Barriers/Preferences   Learning Barriers  Sight    Learning Preferences  Video;Pictoral       Education Topics: Count Your Pulse:  -Group instruction provided by verbal instruction, demonstration, patient participation and written materials to support subject.  Instructors address importance of being able to find your pulse and how to count your pulse when at home without a heart monitor.  Patients get hands on experience counting their pulse with staff help and individually.   CARDIAC REHAB PHASE II EXERCISE from 04/03/2018 in [facility] CARDIAC REHAB  Date  03/27/18  Educator  RN  Instruction Review Code  2- Demonstrated Understanding      Heart Attack, Angina, and Risk Factor Modification:  -Group instruction provided by verbal instruction, video, and written materials to support subject.  Instructors address signs and symptoms of angina and heart attacks.    Also discuss risk factors  for heart disease and how to make changes to improve heart health risk factors.   CARDIAC REHAB PHASE II EXERCISE from 04/03/2018 in [facility] CARDIAC REHAB  Date  03/25/18  Educator  RN  Instruction Review Code  2- Demonstrated Understanding      Functional Fitness:  -Group instruction provided by verbal instruction, demonstration, patient participation, and written materials to support subject.  Instructors address safety measures for doing things around the house.  Discuss how to get up and down off the floor, how to pick things up properly, how to safely get out of a chair without assistance, and balance training.   CARDIAC REHAB PHASE II EXERCISE from 04/03/2018 in [facility] CARDIAC REHAB  Date  04/03/18  Instruction Review Code  2- Demonstrated Understanding      Meditation and Mindfulness:  -Group instruction provided by verbal  instruction, patient participation, and written materials to support subject.  Instructor addresses importance of mindfulness and meditation practice to help reduce stress and improve awareness.  Instructor also leads participants through a meditation exercise.    CARDIAC REHAB PHASE II EXERCISE from 04/03/2018 in [facility] CARDIAC REHAB  Date  03/04/18  Educator  Theda Belfast  Instruction Review Code  2- Demonstrated Understanding      Stretching for Flexibility and Mobility:  -Group instruction provided by verbal instruction, patient participation, and written materials to support subject.  Instructors lead participants through series of stretches that are designed to increase flexibility thus improving mobility.  These stretches are additional exercise for major muscle groups that are typically performed during regular warm up and cool down.   Hands Only CPR:  -Group verbal, video, and participation provides a basic overview of AHA guidelines for community CPR. Role-play of emergencies allow participants the opportunity to practice calling for help and chest compression technique with discussion of AED use.   Hypertension: -Group verbal and written instruction that provides a basic overview of hypertension including the most recent diagnostic guidelines, risk factor reduction with self-care instructions and medication management.    Nutrition I class: Heart Healthy Eating:  -Group instruction provided by PowerPoint slides, verbal discussion, and written materials to support subject matter. The instructor gives an explanation and review of the Therapeutic Lifestyle Changes diet recommendations, which includes a discussion on lipid goals, dietary fat, sodium, fiber, plant stanol/sterol esters, sugar, and the components of a well-balanced, healthy diet.   Nutrition II class: Lifestyle Skills:  -Group instruction provided by PowerPoint slides, verbal discussion, and written  materials to support subject matter. The instructor gives an explanation and review of label reading, grocery shopping for heart health, heart healthy recipe modifications, and ways to make healthier choices when eating out.   Diabetes Question & Answer:  -Group instruction provided by PowerPoint slides, verbal discussion, and written materials to support subject matter. The instructor gives an explanation and review of diabetes co-morbidities, pre- and post-prandial blood glucose goals, pre-exercise blood glucose goals, signs, symptoms, and treatment of hypoglycemia and hyperglycemia, and foot care basics.   Diabetes Blitz:  -Group instruction provided by PowerPoint slides, verbal discussion, and written materials to support subject matter. The instructor gives an explanation and review of the physiology behind type 1 and type 2 diabetes, diabetes medications and rational behind using different medications, pre- and post-prandial blood glucose recommendations and Hemoglobin A1c goals, diabetes diet, and exercise including blood glucose guidelines for exercising safely.    Portion Distortion:  -Group instruction provided by  PowerPoint slides, verbal discussion, written materials, and food models to support subject matter. The instructor gives an explanation of serving size versus portion size, changes in portions sizes over the last 20 years, and what consists of a serving from each food group.   Stress Management:  -Group instruction provided by verbal instruction, video, and written materials to support subject matter.  Instructors review role of stress in heart disease and how to cope with stress positively.     CARDIAC REHAB PHASE II EXERCISE from 04/03/2018 in [facility] CARDIAC REHAB  Date  03/11/18  Educator  RN  Instruction Review Code  2- Demonstrated Understanding      Exercising on Your Own:  -Group instruction provided by verbal instruction, power point, and  written materials to support subject.  Instructors discuss benefits of exercise, components of exercise, frequency and intensity of exercise, and end points for exercise.  Also discuss use of nitroglycerin and activating EMS.  Review options of places to exercise outside of rehab.  Review guidelines for sex with heart disease.   Cardiac Drugs I:  -Group instruction provided by verbal instruction and written materials to support subject.  Instructor reviews cardiac drug classes: antiplatelets, anticoagulants, beta blockers, and statins.  Instructor discusses reasons, side effects, and lifestyle considerations for each drug class.   Cardiac Drugs II:  -Group instruction provided by verbal instruction and written materials to support subject.  Instructor reviews cardiac drug classes: angiotensin converting enzyme inhibitors (ACE-I), angiotensin II receptor blockers (ARBs), nitrates, and calcium channel blockers.  Instructor discusses reasons, side effects, and lifestyle considerations for each drug class.   CARDIAC REHAB PHASE II EXERCISE from 04/03/2018 in [facility] CARDIAC REHAB  Date  04/01/18  Instruction Review Code  2- Demonstrated Understanding      Anatomy and Physiology of the Circulatory System:  Group verbal and written instruction and models provide basic cardiac anatomy and physiology, with the coronary electrical and arterial systems. Review of: AMI, Angina, Valve disease, Heart Failure, Peripheral Artery Disease, Cardiac Arrhythmia, Pacemakers, and the ICD.   CARDIAC REHAB PHASE II EXERCISE from 04/03/2018 in [facility] CARDIAC REHAB  Date  03/18/18  Educator  RN  Instruction Review Code  2- Demonstrated Understanding      Other Education:  -Group or individual verbal, written, or video instructions that support the educational goals of the cardiac rehab program.   Holiday Eating Survival Tips:  -Group instruction provided by PowerPoint  slides, verbal discussion, and written materials to support subject matter. The instructor gives patients tips, tricks, and techniques to help them not only survive but enjoy the holidays despite the onslaught of food that accompanies the holidays.   Knowledge Questionnaire Score: Knowledge Questionnaire Score - 02/26/18 0757      Knowledge Questionnaire Score   Pre Score  18/24        Core Components/Risk Factors/Patient Goals at Admission: Personal Goals and Risk Factors at Admission - 02/26/18 1029      Core Components/Risk Factors/Patient Goals on Admission    Weight Management  Yes;Weight Loss    Intervention  Weight Management: Develop a combined nutrition and exercise program designed to reach desired caloric intake, while maintaining appropriate intake of nutrient and fiber, sodium and fats, and appropriate energy expenditure required for the weight goal.;Weight Management: Provide education and appropriate resources to help participant work on and attain dietary goals.;Weight Management/Obesity: Establish reasonable short term and long term weight goals.    Admit  Weight  291 lb 0.1 oz (132 kg)    Goal Weight: Short Term  281 lb (127.5 kg)    Goal Weight: Long Term  271 lb (122.9 kg)    Expected Outcomes  Short Term: Continue to assess and modify interventions until short term weight is achieved;Long Term: Adherence to nutrition and physical activity/exercise program aimed toward attainment of established weight goal;Weight Loss: Understanding of general recommendations for a balanced deficit meal plan, which promotes 1-2 lb weight loss per week and includes a negative energy balance of [phone] kcal/d;Understanding of distribution of calorie intake throughout the day with the consumption of 4-5 meals/snacks;Understanding recommendations for meals to include 15-35% energy as protein, 25-35% energy from fat, 35-60% energy from carbohydrates, less than 200mg  of dietary cholesterol, 20-35 gm  of total fiber daily    Hypertension  Yes    Intervention  Provide education on lifestyle modifcations including regular physical activity/exercise, weight management, moderate sodium restriction and increased consumption of fresh fruit, vegetables, and low fat dairy, alcohol moderation, and smoking cessation.;Monitor prescription use compliance.    Expected Outcomes  Short Term: Continued assessment and intervention until BP is < 140/30mm HG in hypertensive participants. < 130/11mm HG in hypertensive participants with diabetes, heart failure or chronic kidney disease.;Long Term: Maintenance of blood pressure at goal levels.    Lipids  Yes    Intervention  Provide education and support for participant on nutrition & aerobic/resistive exercise along with prescribed medications to achieve LDL 70mg , HDL >40mg .    Expected Outcomes  Long Term: Cholesterol controlled with medications as prescribed, with individualized exercise RX and with personalized nutrition plan. Value goals: LDL < 70mg , HDL > 40 mg.;Short Term: Participant states understanding of desired cholesterol values and is compliant with medications prescribed. Participant is following exercise prescription and nutrition guidelines.       Core Components/Risk Factors/Patient Goals Review:  Goals and Risk Factor Review    Row Name 03/04/18 6045 03/23/18 1722 04/17/18 1619         Core Components/Risk Factors/Patient Goals Review   Personal Goals Review  Weight Management/Obesity;Lipids;Hypertension  Weight Management/Obesity;Lipids;Hypertension  Weight Management/Obesity;Lipids;Hypertension     Review  Pt with multiple CAD RFs willing to participate in CR exercise. Doran would like to lose weight and learn how to exercise.  Pt with multiple CAD RFs willing to participate in CR exercise. Quantel has started exercise recently and is tolerating it well.   Pt with multiple CAD RFs willing to participate in CR exercise. Yamato continues to do  well.  He has great attendance and feels that he is eastblishing a routine for exercise.      Expected Outcomes  Tyreek will participate in CR exercise, nutrition, and lifestyle modification opportunities.   Per will participate in CR exercise, nutrition, and lifestyle modification opportunities.   Oddie will participate in CR exercise, nutrition, and lifestyle modification opportunities.         Core Components/Risk Factors/Patient Goals at Discharge (Final Review):  Goals and Risk Factor Review - 04/17/18 1619      Core Components/Risk Factors/Patient Goals Review   Personal Goals Review  Weight Management/Obesity;Lipids;Hypertension    Review  Pt with multiple CAD RFs willing to participate in CR exercise. Culver continues to do well.  He has great attendance and feels that he is eastblishing a routine for exercise.     Expected Outcomes  Ruffin will participate in CR exercise, nutrition, and lifestyle modification opportunities.  ITP Comments: ITP Comments    Row Name 02/26/18 0756 03/23/18 1721 04/17/18 1618       ITP Comments  Dr. Armanda Magic, Medical Director   30 Day ITP Review.  Layth is tolerating exercise well. His vitals are stable.   30 Day ITP Review.  Destiny is tolerating exercise well. He feels that he is establishing a routine.  He has great attendance.         Comments: See ITP Comments.

## 2018-04-24 ENCOUNTER — Encounter (HOSPITAL_COMMUNITY): Payer: BLUE CROSS/BLUE SHIELD

## 2018-04-24 ENCOUNTER — Encounter (HOSPITAL_COMMUNITY)
Admission: RE | Admit: 2018-04-24 | Discharge: 2018-04-24 | Disposition: A | Discharge status: Home / Self Care | Payer: Self-pay | Source: Ambulatory Visit | Attending: Internal Medicine | Admitting: Internal Medicine

## 2018-04-24 DIAGNOSIS — I214 Non-ST elevation (NSTEMI) myocardial infarction: Secondary | ICD-10-CM

## 2018-04-27 ENCOUNTER — Encounter (HOSPITAL_COMMUNITY)
Admission: RE | Admit: 2018-04-27 | Discharge: 2018-04-27 | Disposition: A | Discharge status: Home / Self Care | Payer: BLUE CROSS/BLUE SHIELD | Source: Ambulatory Visit | Attending: Internal Medicine | Admitting: Internal Medicine

## 2018-04-27 ENCOUNTER — Encounter (HOSPITAL_COMMUNITY): Payer: BLUE CROSS/BLUE SHIELD

## 2018-04-27 DIAGNOSIS — I214 Non-ST elevation (NSTEMI) myocardial infarction: Secondary | ICD-10-CM

## 2018-04-28 ENCOUNTER — Other Ambulatory Visit: Payer: Self-pay

## 2018-04-28 ENCOUNTER — Telehealth: Payer: Self-pay | Admitting: Cardiology

## 2018-04-28 MED ORDER — NITROGLYCERIN 0.4 MG SL SUBL: 0.4000 mg | SUBLINGUAL_TABLET | SUBLINGUAL | 11 refills | Status: DC | PRN

## 2018-04-28 NOTE — Telephone Encounter (Signed)
Patient lost his medication and is out  1. Which medications need to be refilled? (please list name of each medication and dose if known) Nitroglycerin 0.4mg  2. Which pharmacy/location (including street and city if local pharmacy) is medication to be sent to? [facility] on [address] [employer]  3. Do they need a 30 day or 90 day supply? 30 day supply

## 2018-04-28 NOTE — Telephone Encounter (Signed)
Med refill has been sent. 

## 2018-04-29 ENCOUNTER — Encounter (HOSPITAL_COMMUNITY): Payer: BLUE CROSS/BLUE SHIELD

## 2018-04-29 ENCOUNTER — Encounter (HOSPITAL_COMMUNITY)
Admission: RE | Admit: 2018-04-29 | Discharge: 2018-04-29 | Disposition: A | Discharge status: Home / Self Care | Payer: BLUE CROSS/BLUE SHIELD | Source: Ambulatory Visit | Attending: Internal Medicine | Admitting: Internal Medicine

## 2018-04-29 DIAGNOSIS — I214 Non-ST elevation (NSTEMI) myocardial infarction: Secondary | ICD-10-CM

## 2018-05-01 ENCOUNTER — Encounter (HOSPITAL_COMMUNITY): Payer: BLUE CROSS/BLUE SHIELD

## 2018-05-01 ENCOUNTER — Encounter (HOSPITAL_COMMUNITY)
Admission: RE | Admit: 2018-05-01 | Discharge: 2018-05-01 | Disposition: A | Discharge status: Home / Self Care | Payer: BLUE CROSS/BLUE SHIELD | Source: Ambulatory Visit | Attending: Internal Medicine | Admitting: Internal Medicine

## 2018-05-01 DIAGNOSIS — I214 Non-ST elevation (NSTEMI) myocardial infarction: Secondary | ICD-10-CM

## 2018-05-04 ENCOUNTER — Encounter (HOSPITAL_COMMUNITY): Payer: BLUE CROSS/BLUE SHIELD

## 2018-05-04 ENCOUNTER — Encounter (HOSPITAL_COMMUNITY)
Admission: RE | Admit: 2018-05-04 | Discharge: 2018-05-04 | Disposition: A | Discharge status: Home / Self Care | Payer: BLUE CROSS/BLUE SHIELD | Source: Ambulatory Visit | Attending: Internal Medicine | Admitting: Internal Medicine

## 2018-05-04 DIAGNOSIS — I214 Non-ST elevation (NSTEMI) myocardial infarction: Secondary | ICD-10-CM

## 2018-05-06 ENCOUNTER — Encounter (HOSPITAL_COMMUNITY): Payer: BLUE CROSS/BLUE SHIELD

## 2018-05-06 ENCOUNTER — Encounter (HOSPITAL_COMMUNITY)
Admission: RE | Admit: 2018-05-06 | Discharge: 2018-05-06 | Disposition: A | Discharge status: Home / Self Care | Payer: BLUE CROSS/BLUE SHIELD | Source: Ambulatory Visit | Attending: Internal Medicine | Admitting: Internal Medicine

## 2018-05-06 DIAGNOSIS — I214 Non-ST elevation (NSTEMI) myocardial infarction: Secondary | ICD-10-CM

## 2018-05-08 ENCOUNTER — Encounter (HOSPITAL_COMMUNITY)
Admission: RE | Admit: 2018-05-08 | Discharge: 2018-05-08 | Disposition: A | Discharge status: Home / Self Care | Payer: BLUE CROSS/BLUE SHIELD | Source: Ambulatory Visit | Attending: Internal Medicine | Admitting: Internal Medicine

## 2018-05-08 ENCOUNTER — Encounter (HOSPITAL_COMMUNITY): Payer: BLUE CROSS/BLUE SHIELD

## 2018-05-08 DIAGNOSIS — I214 Non-ST elevation (NSTEMI) myocardial infarction: Secondary | ICD-10-CM

## 2018-05-11 ENCOUNTER — Encounter (HOSPITAL_COMMUNITY)
Admission: RE | Admit: 2018-05-11 | Discharge: 2018-05-11 | Disposition: A | Discharge status: Home / Self Care | Payer: BLUE CROSS/BLUE SHIELD | Source: Ambulatory Visit | Attending: Internal Medicine | Admitting: Internal Medicine

## 2018-05-11 ENCOUNTER — Encounter (HOSPITAL_COMMUNITY): Payer: BLUE CROSS/BLUE SHIELD

## 2018-05-11 VITALS — Ht 68.0 in | Wt 267.6 lb

## 2018-05-11 DIAGNOSIS — I214 Non-ST elevation (NSTEMI) myocardial infarction: Secondary | ICD-10-CM

## 2018-05-13 ENCOUNTER — Encounter (HOSPITAL_COMMUNITY): Payer: BLUE CROSS/BLUE SHIELD

## 2018-05-13 ENCOUNTER — Encounter (HOSPITAL_COMMUNITY)
Admission: RE | Admit: 2018-05-13 | Discharge: 2018-05-13 | Disposition: A | Discharge status: Home / Self Care | Payer: Self-pay | Source: Ambulatory Visit | Attending: Internal Medicine | Admitting: Internal Medicine

## 2018-05-13 DIAGNOSIS — I214 Non-ST elevation (NSTEMI) myocardial infarction: Secondary | ICD-10-CM

## 2018-05-15 ENCOUNTER — Encounter (HOSPITAL_COMMUNITY)
Admission: RE | Admit: 2018-05-15 | Discharge: 2018-05-15 | Disposition: A | Discharge status: Home / Self Care | Payer: BLUE CROSS/BLUE SHIELD | Source: Ambulatory Visit | Attending: Internal Medicine | Admitting: Internal Medicine

## 2018-05-15 ENCOUNTER — Encounter (HOSPITAL_COMMUNITY): Payer: BLUE CROSS/BLUE SHIELD

## 2018-05-15 DIAGNOSIS — I214 Non-ST elevation (NSTEMI) myocardial infarction: Secondary | ICD-10-CM

## 2018-05-18 ENCOUNTER — Encounter (HOSPITAL_COMMUNITY): Payer: BLUE CROSS/BLUE SHIELD

## 2018-05-18 ENCOUNTER — Encounter (HOSPITAL_COMMUNITY)
Admission: RE | Admit: 2018-05-18 | Discharge: 2018-05-18 | Disposition: A | Discharge status: Home / Self Care | Payer: Self-pay | Source: Ambulatory Visit | Attending: Internal Medicine | Admitting: Internal Medicine

## 2018-05-18 DIAGNOSIS — I214 Non-ST elevation (NSTEMI) myocardial infarction: Secondary | ICD-10-CM

## 2018-05-20 ENCOUNTER — Encounter (HOSPITAL_COMMUNITY): Payer: BLUE CROSS/BLUE SHIELD

## 2018-05-20 ENCOUNTER — Encounter (HOSPITAL_COMMUNITY): Payer: Self-pay

## 2018-05-20 ENCOUNTER — Encounter (HOSPITAL_COMMUNITY)
Admission: RE | Admit: 2018-05-20 | Discharge: 2018-05-20 | Disposition: A | Discharge status: Home / Self Care | Payer: Self-pay | Source: Ambulatory Visit | Attending: Internal Medicine | Admitting: Internal Medicine

## 2018-05-20 DIAGNOSIS — Z79899 Other long term (current) drug therapy: Secondary | ICD-10-CM | POA: Insufficient documentation

## 2018-05-20 DIAGNOSIS — Z8673 Personal history of transient ischemic attack (TIA), and cerebral infarction without residual deficits: Secondary | ICD-10-CM | POA: Insufficient documentation

## 2018-05-20 DIAGNOSIS — I110 Hypertensive heart disease with heart failure: Secondary | ICD-10-CM | POA: Insufficient documentation

## 2018-05-20 DIAGNOSIS — G7000 Myasthenia gravis without (acute) exacerbation: Secondary | ICD-10-CM | POA: Insufficient documentation

## 2018-05-20 DIAGNOSIS — I214 Non-ST elevation (NSTEMI) myocardial infarction: Secondary | ICD-10-CM | POA: Insufficient documentation

## 2018-05-20 DIAGNOSIS — I509 Heart failure, unspecified: Secondary | ICD-10-CM | POA: Insufficient documentation

## 2018-05-21 NOTE — Progress Notes (Signed)
Cardiac Individual Treatment Plan  Patient Details  Name: Michael Webster MRN: [id number] Date of Birth: [birth year] Referring Provider:     CARDIAC REHAB PHASE II ORIENTATION from 02/26/2018 in [facility] CARDIAC REHAB  Referring Provider  Duke Salvia MD       Initial Encounter Date:    CARDIAC REHAB PHASE II ORIENTATION from 02/26/2018 in [facility] CARDIAC REHAB  Date  02/26/18      Visit Diagnosis: NSTEMI (non-ST elevated myocardial infarction) [facility])  Patient's Home Medications on Admission:  Current Outpatient Medications:  .  amLODipine (NORVASC) 5 MG tablet, Take 1 tablet (5 mg total) by mouth daily., Disp: 90 tablet, Rfl: 3 .  aspirin EC 81 MG tablet, Take 1 tablet (81 mg total) by mouth daily., Disp: 90 tablet, Rfl: 3 .  docusate sodium (COLACE) 100 MG capsule, Take 100 mg by mouth daily as needed for mild constipation., Disp: , Rfl:  .  famotidine (PEPCID) 20 MG tablet, Take 20 mg by mouth 2 (two) times daily., Disp: , Rfl:  .  hydrochlorothiazide (HYDRODIURIL) 25 MG tablet, Take 1 tablet (25 mg total) by mouth daily., Disp: 90 tablet, Rfl: 3 .  isosorbide mononitrate (IMDUR) 30 MG 24 hr tablet, Take 1 tablet (30 mg total) by mouth daily., Disp: 90 tablet, Rfl: 3 .  ketotifen (ZADITOR) 0.025 % ophthalmic solution, Place 1 drop into both eyes daily as needed., Disp: , Rfl:  .  mycophenolate (CELLCEPT) 500 MG tablet, Take 500 mg by mouth 2 (two) times daily., Disp: , Rfl:  .  nitroGLYCERIN (NITROSTAT) 0.4 MG SL tablet, Place 1 tablet (0.4 mg total) under the tongue every 5 (five) minutes as needed for chest pain., Disp: 25 tablet, Rfl: 11 .  pravastatin (PRAVACHOL) 40 MG tablet, Take 40 mg by mouth every evening., Disp: , Rfl:  .  pyridostigmine (MESTINON) 60 MG tablet, Take 60 mg by mouth 4 (four) times daily., Disp: , Rfl:  .  senna (SENOKOT) 8.6 MG TABS tablet, Take 1 tablet by mouth daily as needed for mild constipation., Disp: , Rfl:    Past Medical History: Past Medical History:  Diagnosis Date  . Congestive heart failure (CHF) (HCC)   . Essential hypertension 08/08/2016  . Gastroesophageal reflux disease 02/24/2018  . GI bleed 07/18/2016  . Hypertension   . Myasthenia gravis (HCC)   . NSTEMI (non-ST elevated myocardial infarction) (HCC) 01/03/2018  . Other hyperlipidemia 05/01/2017  . Snoring 03/12/2018  . Stroke (HCC)     Tobacco Use: Social History   Tobacco Use  Smoking Status Never Smoker  Smokeless Tobacco Never Used    Labs: Recent Review Advice worker    Labs for ITP Cardiac and Pulmonary Rehab Latest Ref Rng & Units 01/04/2018   Cholestrol 0 - 200 mg/dL 782   LDLCALC 0 - 99 mg/dL 94   HDL >95 mg/dL 54   Trlycerides <621 mg/dL 308   Hemoglobin M5H 4.8 - 5.6 % 5.3      Capillary Blood Glucose: No results found for: GLUCAP   Exercise Target Goals: Exercise Program Goal: Individual exercise prescription set using results from initial 6 min walk test and THRR while considering  patient's activity barriers and safety.   Exercise Prescription Goal: Initial exercise prescription builds to 30-45 minutes a day of aerobic activity, 2-3 days per week.  Home exercise guidelines will be given to patient during program as part of exercise prescription that the participant will acknowledge.  Activity Barriers &  Risk Stratification: Activity Barriers & Cardiac Risk Stratification - 02/26/18 1110      Activity Barriers & Cardiac Risk Stratification   Activity Barriers  Muscular Weakness;Neck/Spine Problems;Arthritis    Comments  Myasthenia Gravis    Cardiac Risk Stratification  High       6 Minute Walk: 6 Minute Walk    Row Name 02/26/18 1038 05/11/18 1526       6 Minute Walk   Phase  Initial  Discharge    Distance  1522 feet  1800 feet    Distance % Change  -  18.27 %    Distance Feet Change  -  278 ft    Walk Time  6 minutes  6 minutes    # of Rest Breaks  0  0    MPH  2.88  3.41    METS   2.97  3.59    RPE  11  11    Perceived Dyspnea   0  0    VO2 Peak  10.41  12.57    Symptoms  No  No    Resting HR  86 bpm  71 bpm    Resting BP  142/88  120/70    Resting Oxygen Saturation   97 %  -    Exercise Oxygen Saturation  during 6 min walk  96 %  -    Max Ex. HR  113 bpm  106 bpm    Max Ex. BP  150/82  150/80    2 Minute Post BP  130/80  142/80       Oxygen Initial Assessment:   Oxygen Re-Evaluation:   Oxygen Discharge (Final Oxygen Re-Evaluation):   Initial Exercise Prescription: Initial Exercise Prescription - 02/26/18 1000      Date of Initial Exercise RX and Referring Provider   Date  02/26/18    Referring Provider  Duke Salvia MD     Expected Discharge Date  05/31/18      Treadmill   MPH  2    Grade  1    Minutes  10    METs  2.81      Bike   Level  0.9    Minutes  10    METs  2.31      NuStep   Level  2    SPM  75    Minutes  10    METs  2.5      Prescription Details   Frequency (times per week)  3x    Duration  Progress to 30 minutes of continuous aerobic without signs/symptoms of physical distress      Intensity   THRR 40-80% of Max Heartrate  63-126    Ratings of Perceived Exertion  11-13    Perceived Dyspnea  0-4      Progression   Progression  Continue progressive overload as per policy without signs/symptoms or physical distress.      Resistance Training   Training Prescription  Yes    Weight  4lbs    Reps  10-15       Perform Capillary Blood Glucose checks as needed.  Exercise Prescription Changes: Exercise Prescription Changes    Row Name 03/04/18 0900 03/25/18 1500 04/08/18 1539 04/17/18 1705 05/06/18 1100     Response to Exercise   Blood Pressure (Admit)  136/80  128/80  134/80  124/80  126/74   Blood Pressure (Exercise)  164/90  160/64  158/80  150/74  140/68  Blood Pressure (Exit)  150/84  130/80  122/70  120/84  120/70   Heart Rate (Admit)  93 bpm  90 bpm  74 bpm  83 bpm  70 bpm   Heart Rate (Exercise)  145  bpm  112 bpm  128 bpm  141 bpm  132 bpm   Heart Rate (Exit)  97 bpm  77 bpm  76 bpm  88 bpm  78 bpm   Rating of Perceived Exertion (Exercise)  13  11  12  12  11    Perceived Dyspnea (Exercise)  0  0  0  0  0   Symptoms  None   None   None   None   None   Comments  Pt oriented to exercise equipment   -  None   -  None   Duration  Progress to 30 minutes of  aerobic without signs/symptoms of physical distress  Continue with 30 min of aerobic exercise without signs/symptoms of physical distress.  -  Continue with 30 min of aerobic exercise without signs/symptoms of physical distress.  Continue with 30 min of aerobic exercise without signs/symptoms of physical distress.   Intensity  THRR New  THRR unchanged  -  THRR unchanged  THRR unchanged     Progression   Progression  Continue to progress workloads to maintain intensity without signs/symptoms of physical distress.  Continue to progress workloads to maintain intensity without signs/symptoms of physical distress.  -  Continue to progress workloads to maintain intensity without signs/symptoms of physical distress.  Continue to progress workloads to maintain intensity without signs/symptoms of physical distress.   Average METs  2.3  2.68  3.36  3.41  3.65     Resistance Training   Training Prescription  No  No  No  Yes  No   Weight  -  -  -  5lbs  -   Reps  -  -  -  10-15  -   Time  -  -  -  10 Minutes  -     Interval Training   Interval Training  No  No  No  No  No     Treadmill   MPH  2  2  3.4  2.4  2.7   Grade  1  1  3  3  3    Minutes  10  10  10  10  10    METs  2.81  2.81  3.83  3.83  4.19     Bike   Level  0.9  1.2  1.2  1.2  1.2   Minutes  10  10  10  10  10    METs  2.29  2.75  2.76  2.81  2.85     NuStep   Level  2  3  5  5  5    SPM  75  95  100  100  110   Minutes  10  10  10  10  10    METs  1.8  2.5  3.5  3.5  3.9     Home Exercise Plan   Plans to continue exercise at  -  Home (comment) Walking  Home (comment) Walking   Home (comment) Walking  Home (comment) Walking   Frequency  -  Add 2 additional days to program exercise sessions.  Add 2 additional days to program exercise sessions.  Add 2 additional days to program exercise sessions.  Add 2 additional days  to program exercise sessions.   Initial Home Exercises Provided  -  03/18/18  03/18/18  03/18/18  03/18/18   Row Name 05/20/18 1700             Response to Exercise   Blood Pressure (Admit)  140/84       Blood Pressure (Exercise)  164/88       Blood Pressure (Exit)  136/70       Heart Rate (Admit)  70 bpm       Heart Rate (Exercise)  133 bpm       Heart Rate (Exit)  76 bpm       Rating of Perceived Exertion (Exercise)  12       Perceived Dyspnea (Exercise)  0       Symptoms  Non       Comments  Added Rower to Prescription        Duration  Continue with 30 min of aerobic exercise without signs/symptoms of physical distress.       Intensity  THRR unchanged         Progression   Progression  Continue to progress workloads to maintain intensity without signs/symptoms of physical distress.       Average METs  4.21         Resistance Training   Training Prescription  No         Interval Training   Interval Training  No         Treadmill   MPH  2.7       Grade  3       Minutes  10       METs  4.19         NuStep   Level  5       SPM  110       Minutes  10       METs  4.1         Rower   Level  3       Watts  30       Minutes  10       METs  4.33         Home Exercise Plan   Plans to continue exercise at  Home (comment) Walking       Frequency  Add 2 additional days to program exercise sessions.       Initial Home Exercises Provided  03/18/18          Exercise Comments: Exercise Comments    Row Name 03/04/18 8119 03/25/18 1448 04/22/18 1709 05/08/18 1521 05/08/18 1524   Exercise Comments  Pt's first day of exercise. Pt oriented to exercise equipment. Will continue to monitor and progress pt.   Pt is responding well to  exercise prescription. Pt is currently walking 4 days, 30 minutes at home. Will continue to monitor and progress pt as tolerated.   Reviewed METs and Goals with pt. Pt is making great progress in Cardiac Rehab. Pt is exercising on his own and enjoying rehab. Pt is enjoying rehab so much, he has decided to volunteer for the department.   Reviewed METs and Goals with pt.   Reviewed METs and Goals with pt. Pt is continuing to respond well to workloads.       Exercise Goals and Review: Exercise Goals    Row Name 02/26/18 0805             Exercise Goals   Increase  Physical Activity  Yes       Intervention  Provide advice, education, support and counseling about physical activity/exercise needs.;Develop an individualized exercise prescription for aerobic and resistive training based on initial evaluation findings, risk stratification, comorbidities and participant's personal goals.       Expected Outcomes  Short Term: Attend rehab on a regular basis to increase amount of physical activity.;Long Term: Add in home exercise to make exercise part of routine and to increase amount of physical activity.;Long Term: Exercising regularly at least 3-5 days a week.       Increase Strength and Stamina  Yes       Intervention  Develop an individualized exercise prescription for aerobic and resistive training based on initial evaluation findings, risk stratification, comorbidities and participant's personal goals.;Provide advice, education, support and counseling about physical activity/exercise needs.       Expected Outcomes  Short Term: Increase workloads from initial exercise prescription for resistance, speed, and METs.;Short Term: Perform resistance training exercises routinely during rehab and add in resistance training at home;Long Term: Improve cardiorespiratory fitness, muscular endurance and strength as measured by increased METs and functional capacity ( )       Able to understand and use rate of  perceived exertion (RPE) scale  Yes       Intervention  Provide education and explanation on how to use RPE scale       Expected Outcomes  Short Term: Able to use RPE daily in rehab to express subjective intensity level;Long Term:  Able to use RPE to guide intensity level when exercising independently       Knowledge and understanding of Target Heart Rate Range (THRR)  Yes       Intervention  Provide education and explanation of THRR including how the numbers were predicted and where they are located for reference       Expected Outcomes  Long Term: Able to use THRR to govern intensity when exercising independently;Short Term: Able to state/look up THRR;Short Term: Able to use daily as guideline for intensity in rehab       Able to check pulse independently  Yes       Intervention  Provide education and demonstration on how to check pulse in carotid and radial arteries.;Review the importance of being able to check your own pulse for safety during independent exercise       Expected Outcomes  Short Term: Able to explain why pulse checking is important during independent exercise;Long Term: Able to check pulse independently and accurately       Understanding of Exercise Prescription  Yes       Intervention  Provide education, explanation, and written materials on patient's individual exercise prescription       Expected Outcomes  Short Term: Able to explain program exercise prescription;Long Term: Able to explain home exercise prescription to exercise independently          Exercise Goals Re-Evaluation : Exercise Goals Re-Evaluation    Row Name 03/18/18 0727 04/22/18 1712 05/08/18 1521         Exercise Goal Re-Evaluation   Exercise Goals Review  Understanding of Exercise Prescription;Knowledge and understanding of Target Heart Rate Range (THRR);Able to understand and use rate of perceived exertion (RPE) scale  Increase Physical Activity;Understanding of Exercise Prescription  Increase Physical  Activity;Understanding of Exercise Prescription     Comments  Reviewed home exercise guidelines with patient including THRR, RPE scale, and endpoints for exercise. Pt is currently walking 30 minutes on  T,Th, Sat, and Sun at the mall or [place] as his mode of home exercise. Pt is thinking about joining [employer].  Pt has goal to decrease weight and pt is doing a great job with losing weight. Pt is exercising with no difficulty. Will follow up with pt regarding increasing handweights to 6lbs.   Pt is continuing to make good progress in Cardiac Rehab. Spoke with pt about exercising at [employer]. Pt had questions regarding new machines and time he should spend per machine. Reminded pt how to safely progress workloads.      Expected Outcomes  Patient will continue walking 4 days/week in addition to exercie at cardiac rehab to help achieve personal health and fitness goals.  Pt will continue to exercise 5-6 days a week at [employer] and Cardiac Rehab. Pt will continue to increase strength and stamina. Will continue to monitor and progress pt as tolerated.   Pt will continue to exercise 2-3 days, 45 minutes at Cardiac Rehab. Pt would like to try elliptical and rower machine. Will work with pt to try out new machines. Pt will continue to increase cardiovascular strength.         Discharge Exercise Prescription (Final Exercise Prescription Changes): Exercise Prescription Changes - 05/20/18 1700      Response to Exercise   Blood Pressure (Admit)  140/84    Blood Pressure (Exercise)  164/88    Blood Pressure (Exit)  136/70    Heart Rate (Admit)  70 bpm    Heart Rate (Exercise)  133 bpm    Heart Rate (Exit)  76 bpm    Rating of Perceived Exertion (Exercise)  12    Perceived Dyspnea (Exercise)  0    Symptoms  Non    Comments  Added Rower to Prescription     Duration  Continue with 30 min of aerobic exercise without signs/symptoms of physical distress.    Intensity  THRR unchanged       Progression   Progression  Continue to progress workloads to maintain intensity without signs/symptoms of physical distress.    Average METs  4.21      Resistance Training   Training Prescription  No      Interval Training   Interval Training  No      Treadmill   MPH  2.7    Grade  3    Minutes  10    METs  4.19      NuStep   Level  5    SPM  110    Minutes  10    METs  4.1      Rower   Level  3    Watts  30    Minutes  10    METs  4.33      Home Exercise Plan   Plans to continue exercise at  Home (comment)   Walking   Frequency  Add 2 additional days to program exercise sessions.    Initial Home Exercises Provided  03/18/18       Nutrition:  Target Goals: Understanding of nutrition guidelines, daily intake of sodium 1500mg , cholesterol 200mg , calories 30% from fat and 7% or less from saturated fats, daily to have 5 or more servings of fruits and vegetables.  Biometrics: Pre Biometrics - 02/26/18 1044      Pre Biometrics   Height  5\' 8"  (1.727 m)    Weight  132 kg    Waist Circumference  54.5 inches  Hip Circumference  49.75 inches    Waist to Hip Ratio  1.1 %    BMI (Calculated)  44.26    Triceps Skinfold  27 mm    % Body Fat  43 %    Grip Strength  44 kg    Flexibility  12.5 in    Single Leg Stand  25.49 seconds      Post Biometrics - 05/11/18 1528       Post  Biometrics   Height  5\' 8"  (1.727 m)    Weight  121.4 kg    Waist Circumference  52 inches    Hip Circumference  50 inches    Waist to Hip Ratio  1.04 %    BMI (Calculated)  40.7    Triceps Skinfold  25 mm    % Body Fat  40.1 %    Grip Strength  48 kg    Flexibility  13 in    Single Leg Stand  30 seconds       Nutrition Therapy Plan and Nutrition Goals: Nutrition Therapy & Goals - 02/26/18 1213      Nutrition Therapy   Diet  heart healthy      Personal Nutrition Goals   Nutrition Goal  Pt to identify food quantities necessary to achieve weight loss of 6-24 lbs. at graduation  from cardiac rehab.    Personal Goal #2  Pt to identify and limit food sources of saturated fat, trans fat, and sodium      Intervention Plan   Intervention  Prescribe, educate and counsel regarding individualized specific dietary modifications aiming towards targeted core components such as weight, hypertension, lipid management, diabetes, heart failure and other comorbidities.    Expected Outcomes  Short Term Goal: Understand basic principles of dietary content, such as calories, fat, sodium, cholesterol and nutrients.       Nutrition Assessments: Nutrition Assessments - 02/26/18 1214      MEDFICTS Scores   Pre Score  30       Nutrition Goals Re-Evaluation: Nutrition Goals Re-Evaluation    Row Name 02/26/18 1213             Goals   Current Weight  291 lb 0.1 oz (132 kg)          Nutrition Goals Re-Evaluation: Nutrition Goals Re-Evaluation    Row Name 02/26/18 1213             Goals   Current Weight  291 lb 0.1 oz (132 kg)          Nutrition Goals Discharge (Final Nutrition Goals Re-Evaluation): Nutrition Goals Re-Evaluation - 02/26/18 1213      Goals   Current Weight  291 lb 0.1 oz (132 kg)       Psychosocial: Target Goals: Acknowledge presence or absence of significant depression and/or stress, maximize coping skills, provide positive support system. Participant is able to verbalize types and ability to use techniques and skills needed for reducing stress and depression.  Initial Review & Psychosocial Screening: Initial Psych Review & Screening - 02/26/18 0757      Initial Review   Current issues with  None Identified      Family Dynamics   Good Support System?  Yes   family love and support      Barriers   Psychosocial barriers to participate in program  There are no identifiable barriers or psychosocial needs.      Screening Interventions   Interventions  Encouraged to exercise  Quality of Life Scores: Quality of Life - 05/13/18 1637       Quality of Life   Select  Quality of Life      Quality of Life Scores   Health/Function Pre  28.6 %    Health/Function Post  30 %    Health/Function % Change  4.9 %    Socioeconomic Pre  25.6 %    Socioeconomic Post  30 %    Socioeconomic % Change   17.19 %    Psych/Spiritual Pre  30 %    Psych/Spiritual Post  30 %    Psych/Spiritual % Change  0 %    Family Pre  26.4 %    Family Post  30 %    Family % Change  13.64 %    GLOBAL Pre  27.9 %    GLOBAL Post  30 %    GLOBAL % Change  7.53 %      Scores of 19 and below usually indicate a poorer quality of life in these areas.  A difference of  2-3 points is a clinically meaningful difference.  A difference of 2-3 points in the total score of the Quality of Life Index has been associated with significant improvement in overall quality of life, self-image, physical symptoms, and general health in studies assessing change in quality of life.  PHQ-9: Recent Review Flowsheet Data    There is no flowsheet data to display.     Interpretation of Total Score  Total Score Depression Severity:  1-4 = Minimal depression, 5-9 = Mild depression, 10-14 = Moderate depression, 15-19 = Moderately severe depression, 20-27 = Severe depression   Psychosocial Evaluation and Intervention: Psychosocial Evaluation - 03/04/18 0817      Psychosocial Evaluation & Interventions   Interventions  Encouraged to exercise with the program and follow exercise prescription    Comments  No psychosocial needs identified. No intervention necesary. Pt enjoys going to the movies and watching football and baseball.    Expected Outcomes  Tyrez wil exhibit a postive outlook with good coping skills.    Continue Psychosocial Services   No Follow up required       Psychosocial Re-Evaluation: Psychosocial Re-Evaluation    Row Name 03/23/18 1723 04/17/18 1619 05/20/18 1733         Psychosocial Re-Evaluation   Current issues with  None Identified  None Identified   None Identified     Comments  No psychosoical needs identified.   No psychosoical needs identified.   No psychosoical needs identified.      Expected Outcomes  Larnell will maintain a positive outlook with good coping skills.   Ivo will maintain a positive outlook with good coping skills.   Daimon will maintain a positive outlook with good coping skills.      Interventions  Encouraged to attend Cardiac Rehabilitation for the exercise  Encouraged to attend Cardiac Rehabilitation for the exercise  Encouraged to attend Cardiac Rehabilitation for the exercise     Continue Psychosocial Services   No Follow up required  No Follow up required  No Follow up required        Psychosocial Discharge (Final Psychosocial Re-Evaluation): Psychosocial Re-Evaluation - 05/20/18 1733      Psychosocial Re-Evaluation   Current issues with  None Identified    Comments  No psychosoical needs identified.     Expected Outcomes  Jamaris will maintain a positive outlook with good coping skills.     Interventions  Encouraged  to attend Cardiac Rehabilitation for the exercise    Continue Psychosocial Services   No Follow up required       Vocational Rehabilitation: Provide vocational rehab assistance to qualifying candidates.   Vocational Rehab Evaluation & Intervention:   Education: Education Goals: Education classes will be provided on a weekly basis, covering required topics. Participant will state understanding/return demonstration of topics presented.  Learning Barriers/Preferences: Learning Barriers/Preferences - 02/26/18 0805      Learning Barriers/Preferences   Learning Barriers  Sight    Learning Preferences  Video;Pictoral       Education Topics: Count Your Pulse:  -Group instruction provided by verbal instruction, demonstration, patient participation and written materials to support subject.  Instructors address importance of being able to find your pulse and how to count your pulse when at  home without a heart monitor.  Patients get hands on experience counting their pulse with staff help and individually.   CARDIAC REHAB PHASE II EXERCISE from 04/29/2018 in [facility] CARDIAC REHAB  Date  03/27/18  Educator  RN  Instruction Review Code  2- Demonstrated Understanding      Heart Attack, Angina, and Risk Factor Modification:  -Group instruction provided by verbal instruction, video, and written materials to support subject.  Instructors address signs and symptoms of angina and heart attacks.    Also discuss risk factors for heart disease and how to make changes to improve heart health risk factors.   CARDIAC REHAB PHASE II EXERCISE from 04/29/2018 in [facility] CARDIAC REHAB  Date  03/25/18  Educator  RN  Instruction Review Code  2- Demonstrated Understanding      Functional Fitness:  -Group instruction provided by verbal instruction, demonstration, patient participation, and written materials to support subject.  Instructors address safety measures for doing things around the house.  Discuss how to get up and down off the floor, how to pick things up properly, how to safely get out of a chair without assistance, and balance training.   CARDIAC REHAB PHASE II EXERCISE from 04/29/2018 in [facility] CARDIAC REHAB  Date  04/03/18  Instruction Review Code  2- Demonstrated Understanding      Meditation and Mindfulness:  -Group instruction provided by verbal instruction, patient participation, and written materials to support subject.  Instructor addresses importance of mindfulness and meditation practice to help reduce stress and improve awareness.  Instructor also leads participants through a meditation exercise.    CARDIAC REHAB PHASE II EXERCISE from 04/29/2018 in [facility] CARDIAC REHAB  Date  03/04/18  Educator  Theda Belfast  Instruction Review Code  2- Demonstrated Understanding      Stretching for  Flexibility and Mobility:  -Group instruction provided by verbal instruction, patient participation, and written materials to support subject.  Instructors lead participants through series of stretches that are designed to increase flexibility thus improving mobility.  These stretches are additional exercise for major muscle groups that are typically performed during regular warm up and cool down.   Hands Only CPR:  -Group verbal, video, and participation provides a basic overview of AHA guidelines for community CPR. Role-play of emergencies allow participants the opportunity to practice calling for help and chest compression technique with discussion of AED use.   Hypertension: -Group verbal and written instruction that provides a basic overview of hypertension including the most recent diagnostic guidelines, risk factor reduction with self-care instructions and medication management.    Nutrition I class: Heart Healthy  Eating:  -Group instruction provided by PowerPoint slides, verbal discussion, and written materials to support subject matter. The instructor gives an explanation and review of the Therapeutic Lifestyle Changes diet recommendations, which includes a discussion on lipid goals, dietary fat, sodium, fiber, plant stanol/sterol esters, sugar, and the components of a well-balanced, healthy diet.   Nutrition II class: Lifestyle Skills:  -Group instruction provided by PowerPoint slides, verbal discussion, and written materials to support subject matter. The instructor gives an explanation and review of label reading, grocery shopping for heart health, heart healthy recipe modifications, and ways to make healthier choices when eating out.   Diabetes Question & Answer:  -Group instruction provided by PowerPoint slides, verbal discussion, and written materials to support subject matter. The instructor gives an explanation and review of diabetes co-morbidities, pre- and post-prandial blood  glucose goals, pre-exercise blood glucose goals, signs, symptoms, and treatment of hypoglycemia and hyperglycemia, and foot care basics.   Diabetes Blitz:  -Group instruction provided by PowerPoint slides, verbal discussion, and written materials to support subject matter. The instructor gives an explanation and review of the physiology behind type 1 and type 2 diabetes, diabetes medications and rational behind using different medications, pre- and post-prandial blood glucose recommendations and Hemoglobin A1c goals, diabetes diet, and exercise including blood glucose guidelines for exercising safely.    Portion Distortion:  -Group instruction provided by PowerPoint slides, verbal discussion, written materials, and food models to support subject matter. The instructor gives an explanation of serving size versus portion size, changes in portions sizes over the last 20 years, and what consists of a serving from each food group.   Stress Management:  -Group instruction provided by verbal instruction, video, and written materials to support subject matter.  Instructors review role of stress in heart disease and how to cope with stress positively.     CARDIAC REHAB PHASE II EXERCISE from 04/29/2018 in [facility] CARDIAC REHAB  Date  03/11/18  Educator  RN  Instruction Review Code  2- Demonstrated Understanding      Exercising on Your Own:  -Group instruction provided by verbal instruction, power point, and written materials to support subject.  Instructors discuss benefits of exercise, components of exercise, frequency and intensity of exercise, and end points for exercise.  Also discuss use of nitroglycerin and activating EMS.  Review options of places to exercise outside of rehab.  Review guidelines for sex with heart disease.   Cardiac Drugs I:  -Group instruction provided by verbal instruction and written materials to support subject.  Instructor reviews cardiac drug classes:  antiplatelets, anticoagulants, beta blockers, and statins.  Instructor discusses reasons, side effects, and lifestyle considerations for each drug class.   CARDIAC REHAB PHASE II EXERCISE from 04/29/2018 in [facility] CARDIAC REHAB  Date  04/29/18  Instruction Review Code  2- Demonstrated Understanding      Cardiac Drugs II:  -Group instruction provided by verbal instruction and written materials to support subject.  Instructor reviews cardiac drug classes: angiotensin converting enzyme inhibitors (ACE-I), angiotensin II receptor blockers (ARBs), nitrates, and calcium channel blockers.  Instructor discusses reasons, side effects, and lifestyle considerations for each drug class.   CARDIAC REHAB PHASE II EXERCISE from 04/29/2018 in [facility] CARDIAC REHAB  Date  04/01/18  Instruction Review Code  2- Demonstrated Understanding      Anatomy and Physiology of the Circulatory System:  Group verbal and written instruction and models provide basic cardiac anatomy and physiology, with  the coronary electrical and arterial systems. Review of: AMI, Angina, Valve disease, Heart Failure, Peripheral Artery Disease, Cardiac Arrhythmia, Pacemakers, and the ICD.   CARDIAC REHAB PHASE II EXERCISE from 04/29/2018 in [facility] CARDIAC REHAB  Date  03/18/18  Educator  RN  Instruction Review Code  2- Demonstrated Understanding      Other Education:  -Group or individual verbal, written, or video instructions that support the educational goals of the cardiac rehab program.   Holiday Eating Survival Tips:  -Group instruction provided by PowerPoint slides, verbal discussion, and written materials to support subject matter. The instructor gives patients tips, tricks, and techniques to help them not only survive but enjoy the holidays despite the onslaught of food that accompanies the holidays.   Knowledge Questionnaire Score: Knowledge Questionnaire Score -  05/13/18 1628      Knowledge Questionnaire Score   Pre Score  18/24     Post Score  19/24       Core Components/Risk Factors/Patient Goals at Admission: Personal Goals and Risk Factors at Admission - 02/26/18 1029      Core Components/Risk Factors/Patient Goals on Admission    Weight Management  Yes;Weight Loss    Intervention  Weight Management: Develop a combined nutrition and exercise program designed to reach desired caloric intake, while maintaining appropriate intake of nutrient and fiber, sodium and fats, and appropriate energy expenditure required for the weight goal.;Weight Management: Provide education and appropriate resources to help participant work on and attain dietary goals.;Weight Management/Obesity: Establish reasonable short term and long term weight goals.    Admit Weight  291 lb 0.1 oz (132 kg)    Goal Weight: Short Term  281 lb (127.5 kg)    Goal Weight: Long Term  271 lb (122.9 kg)    Expected Outcomes  Short Term: Continue to assess and modify interventions until short term weight is achieved;Long Term: Adherence to nutrition and physical activity/exercise program aimed toward attainment of established weight goal;Weight Loss: Understanding of general recommendations for a balanced deficit meal plan, which promotes 1-2 lb weight loss per week and includes a negative energy balance of [phone] kcal/d;Understanding of distribution of calorie intake throughout the day with the consumption of 4-5 meals/snacks;Understanding recommendations for meals to include 15-35% energy as protein, 25-35% energy from fat, 35-60% energy from carbohydrates, less than 200mg  of dietary cholesterol, 20-35 gm of total fiber daily    Hypertension  Yes    Intervention  Provide education on lifestyle modifcations including regular physical activity/exercise, weight management, moderate sodium restriction and increased consumption of fresh fruit, vegetables, and low fat dairy, alcohol moderation, and  smoking cessation.;Monitor prescription use compliance.    Expected Outcomes  Short Term: Continued assessment and intervention until BP is < 140/24mm HG in hypertensive participants. < 130/3mm HG in hypertensive participants with diabetes, heart failure or chronic kidney disease.;Long Term: Maintenance of blood pressure at goal levels.    Lipids  Yes    Intervention  Provide education and support for participant on nutrition & aerobic/resistive exercise along with prescribed medications to achieve LDL 70mg , HDL >40mg .    Expected Outcomes  Long Term: Cholesterol controlled with medications as prescribed, with individualized exercise RX and with personalized nutrition plan. Value goals: LDL < 70mg , HDL > 40 mg.;Short Term: Participant states understanding of desired cholesterol values and is compliant with medications prescribed. Participant is following exercise prescription and nutrition guidelines.       Core Components/Risk Factors/Patient Goals Review:  Goals and  Risk Factor Review    Row Name 03/04/18 1610 03/23/18 1722 04/17/18 1619 05/20/18 1733       Core Components/Risk Factors/Patient Goals Review   Personal Goals Review  Weight Management/Obesity;Lipids;Hypertension  Weight Management/Obesity;Lipids;Hypertension  Weight Management/Obesity;Lipids;Hypertension  Weight Management/Obesity;Lipids;Hypertension    Review  Pt with multiple CAD RFs willing to participate in CR exercise. Callum would like to lose weight and learn how to exercise.  Pt with multiple CAD RFs willing to participate in CR exercise. Mario has started exercise recently and is tolerating it well.   Pt with multiple CAD RFs willing to participate in CR exercise. Ayvion continues to do well.  He has great attendance and feels that he is eastblishing a routine for exercise.   Pt with multiple CAD RFs willing to participate in CR exercise. Jahdiel continues to do well.  His vitals are stable.  Hosea will graduate soon.      Expected Outcomes  Matias will participate in CR exercise, nutrition, and lifestyle modification opportunities.   Graysyn will participate in CR exercise, nutrition, and lifestyle modification opportunities.   Sanchez will participate in CR exercise, nutrition, and lifestyle modification opportunities.   Burnett will participate in CR exercise, nutrition, and lifestyle modification opportunities.        Core Components/Risk Factors/Patient Goals at Discharge (Final Review):  Goals and Risk Factor Review - 05/20/18 1733      Core Components/Risk Factors/Patient Goals Review   Personal Goals Review  Weight Management/Obesity;Lipids;Hypertension    Review  Pt with multiple CAD RFs willing to participate in CR exercise. Enzio continues to do well.  His vitals are stable.  Avier will graduate soon.     Expected Outcomes  Jeran will participate in CR exercise, nutrition, and lifestyle modification opportunities.        ITP Comments: ITP Comments    Row Name 02/26/18 0756 03/23/18 1721 04/17/18 1618 05/20/18 1733     ITP Comments  Dr. Armanda Magic, Medical Director   30 Day ITP Review.  Christophor is tolerating exercise well. His vitals are stable.   30 Day ITP Review.  Saheed is tolerating exercise well. He feels that he is establishing a routine.  He has great attendance.   30 Day ITP Review. Faiz is doing very well.  He is tolerating exercise well. He will graduate soon.        Comments: See ITP Comments.

## 2018-05-22 ENCOUNTER — Encounter (HOSPITAL_COMMUNITY)
Admission: RE | Admit: 2018-05-22 | Discharge: 2018-05-22 | Disposition: A | Discharge status: Home / Self Care | Payer: BLUE CROSS/BLUE SHIELD | Source: Ambulatory Visit | Attending: Internal Medicine | Admitting: Internal Medicine

## 2018-05-22 ENCOUNTER — Encounter (HOSPITAL_COMMUNITY): Payer: BLUE CROSS/BLUE SHIELD

## 2018-05-22 DIAGNOSIS — I214 Non-ST elevation (NSTEMI) myocardial infarction: Secondary | ICD-10-CM

## 2018-05-25 ENCOUNTER — Encounter (HOSPITAL_COMMUNITY)
Admission: RE | Admit: 2018-05-25 | Discharge: 2018-05-25 | Disposition: A | Discharge status: Home / Self Care | Payer: BLUE CROSS/BLUE SHIELD | Source: Ambulatory Visit | Attending: Internal Medicine | Admitting: Internal Medicine

## 2018-05-25 ENCOUNTER — Encounter (HOSPITAL_COMMUNITY): Payer: BLUE CROSS/BLUE SHIELD

## 2018-05-25 DIAGNOSIS — I214 Non-ST elevation (NSTEMI) myocardial infarction: Secondary | ICD-10-CM

## 2018-05-27 ENCOUNTER — Encounter (HOSPITAL_COMMUNITY): Payer: BLUE CROSS/BLUE SHIELD

## 2018-05-29 ENCOUNTER — Encounter (HOSPITAL_COMMUNITY): Payer: BLUE CROSS/BLUE SHIELD

## 2018-06-01 ENCOUNTER — Encounter (HOSPITAL_COMMUNITY): Payer: BLUE CROSS/BLUE SHIELD

## 2018-06-03 ENCOUNTER — Encounter (HOSPITAL_COMMUNITY): Payer: BLUE CROSS/BLUE SHIELD

## 2018-06-05 ENCOUNTER — Encounter (HOSPITAL_COMMUNITY): Payer: BLUE CROSS/BLUE SHIELD

## 2018-06-08 ENCOUNTER — Encounter (HOSPITAL_COMMUNITY): Payer: BLUE CROSS/BLUE SHIELD

## 2018-06-10 ENCOUNTER — Encounter (HOSPITAL_COMMUNITY): Payer: BLUE CROSS/BLUE SHIELD

## 2018-06-10 NOTE — Progress Notes (Signed)
Discharge Progress Report  Patient Details  Name: Michael Webster MRN: [id number] Date of Birth: 24-Jan-1956 Referring Provider:     [facility] from 02/26/2018 in [place]  Referring Provider  Deboraha Sprang MD        Number of Visits: 36  Reason for Discharge:  Patient reached a stable level of exercise. Patient independent in their exercise. Patient has met program and personal goals.  Smoking History:  Social History   Tobacco Use  Smoking Status Never Smoker  Smokeless Tobacco Never Used    Diagnosis:  NSTEMI (non-ST elevated myocardial infarction) ([place])  ADL UCSD:   Initial Exercise Prescription: Initial Exercise Prescription - 02/26/18 1000      Date of Initial Exercise RX and Referring Provider   Date  02/26/18    Referring Provider  Deboraha Sprang MD     Expected Discharge Date  05/31/18      Treadmill   MPH  2    Grade  1    Minutes  10    METs  2.81      Bike   Level  0.9    Minutes  10    METs  2.31      NuStep   Level  2    SPM  75    Minutes  10    METs  2.5      Prescription Details   Frequency (times per week)  3x    Duration  Progress to 30 minutes of continuous aerobic without signs/symptoms of physical distress      Intensity   THRR 40-80% of Max Heartrate  63-126    Ratings of Perceived Exertion  11-13    Perceived Dyspnea  0-4      Progression   Progression  Continue progressive overload as per policy without signs/symptoms or physical distress.      Resistance Training   Training Prescription  Yes    Weight  4lbs    Reps  10-15       Discharge Exercise Prescription (Final Exercise Prescription Changes): Exercise Prescription Changes - 05/25/18 1621      Response to Exercise   Blood Pressure (Admit)  124/74    Blood Pressure (Exercise)  160/80    Blood Pressure (Exit)  128/60    Heart Rate (Admit)  67 bpm    Heart Rate (Exercise)  134 bpm    Heart Rate (Exit)   76 bpm    Rating of Perceived Exertion (Exercise)  12    Perceived Dyspnea (Exercise)  0    Symptoms  Non    Comments  Pt graduated from Cardiac Rehab     Duration  Continue with 30 min of aerobic exercise without signs/symptoms of physical distress.    Intensity  THRR unchanged      Progression   Progression  Continue to progress workloads to maintain intensity without signs/symptoms of physical distress.    Average METs  4.2      Resistance Training   Training Prescription  Yes    Weight  6lbs    Reps  10-15    Time  10 Minutes      Interval Training   Interval Training  No      Treadmill   MPH  2.7    Grade  3    Minutes  10    METs  4.19      NuStep   Level  5    SPM  110    Minutes  10    METs  4.1      Rower   Level  3    Watts  31    Minutes  10    METs  4.33      Home Exercise Plan   Plans to continue exercise at  [employer] (comment)   [employer]    Frequency  Add 3 additional days to program exercise sessions.    Initial Home Exercises Provided  03/18/18       Functional Capacity: 6 Minute Walk    Row Name 02/26/18 1038 05/11/18 1526       6 Minute Walk   Phase  Initial  Discharge    Distance  1522 feet  1800 feet    Distance % Change  -  18.27 %    Distance Feet Change  -  278 ft    Walk Time  6 minutes  6 minutes    # of Rest Breaks  0  0    MPH  2.88  3.41    METS  2.97  3.59    RPE  11  11    Perceived Dyspnea   0  0    VO2 Peak  10.41  12.57    Symptoms  No  No    Resting HR  86 bpm  71 bpm    Resting BP  142/88  120/70    Resting Oxygen Saturation   97 %  -    Exercise Oxygen Saturation  during 6 min walk  96 %  -    Max Ex. HR  113 bpm  106 bpm    Max Ex. BP  150/82  150/80    2 Minute Post BP  130/80  142/80       Psychological, QOL, Others - Outcomes: PHQ 2/9: Depression screen PHQ 2/9 06/10/2018  Decreased Interest 0  Down, Depressed, Hopeless 0  PHQ - 2 Score 0    Quality of Life: Quality of Life -  05/13/18 1637      Quality of Life   Select  Quality of Life      Quality of Life Scores   Health/Function Pre  28.6 %    Health/Function Post  30 %    Health/Function % Change  4.9 %    Socioeconomic Pre  25.6 %    Socioeconomic Post  30 %    Socioeconomic % Change   17.19 %    Psych/Spiritual Pre  30 %    Psych/Spiritual Post  30 %    Psych/Spiritual % Change  0 %    Family Pre  26.4 %    Family Post  30 %    Family % Change  13.64 %    GLOBAL Pre  27.9 %    GLOBAL Post  30 %    GLOBAL % Change  7.53 %       Personal Goals: Goals established at orientation with interventions provided to work toward goal. Personal Goals and Risk Factors at Admission - 02/26/18 1029      Core Components/Risk Factors/Patient Goals on Admission    Weight Management  Yes;Weight Loss    Intervention  Weight Management: Develop a combined nutrition and exercise program designed to reach desired caloric intake, while maintaining appropriate intake of nutrient and fiber, sodium and fats, and appropriate energy expenditure required for the weight goal.;Weight Management: Provide  education and appropriate resources to help participant work on and attain dietary goals.;Weight Management/Obesity: Establish reasonable short term and long term weight goals.    Admit Weight  291 lb 0.1 oz (132 kg)    Goal Weight: Short Term  281 lb (127.5 kg)    Goal Weight: Long Term  271 lb (122.9 kg)    Expected Outcomes  Short Term: Continue to assess and modify interventions until short term weight is achieved;Long Term: Adherence to nutrition and physical activity/exercise program aimed toward attainment of established weight goal;Weight Loss: Understanding of general recommendations for a balanced deficit meal plan, which promotes 1-2 lb weight loss per week and includes a negative energy balance of [phone] kcal/d;Understanding of distribution of calorie intake throughout the day with the consumption of 4-5  meals/snacks;Understanding recommendations for meals to include 15-35% energy as protein, 25-35% energy from fat, 35-60% energy from carbohydrates, less than 286m of dietary cholesterol, 20-35 gm of total fiber daily    Hypertension  Yes    Intervention  Provide education on lifestyle modifcations including regular physical activity/exercise, weight management, moderate sodium restriction and increased consumption of fresh fruit, vegetables, and low fat dairy, alcohol moderation, and smoking cessation.;Monitor prescription use compliance.    Expected Outcomes  Short Term: Continued assessment and intervention until BP is < 140/917mHG in hypertensive participants. < 130/8071mG in hypertensive participants with diabetes, heart failure or chronic kidney disease.;Long Term: Maintenance of blood pressure at goal levels.    Lipids  Yes    Intervention  Provide education and support for participant on nutrition & aerobic/resistive exercise along with prescribed medications to achieve LDL <18m3mDL >40mg50m Expected Outcomes  Long Term: Cholesterol controlled with medications as prescribed, with individualized exercise RX and with personalized nutrition plan. Value goals: LDL < 18mg,18m > 40 mg.;Short Term: Participant states understanding of desired cholesterol values and is compliant with medications prescribed. Participant is following exercise prescription and nutrition guidelines.        Personal Goals Discharge: Goals and Risk Factor Review    Row Name 03/04/18 0812 07371/[id number] 1722 04/17/18 1619 05/20/18 1733 05/25/18 0746     Core Components/Risk Factors/Patient Goals Review   Personal Goals Review  Weight Management/Obesity;Lipids;Hypertension  Weight Management/Obesity;Lipids;Hypertension  Weight Management/Obesity;Lipids;Hypertension  Weight Management/Obesity;Lipids;Hypertension  Weight Management/Obesity;Lipids;Hypertension;Improve shortness of breath with ADL's   Review  Pt with multiple CAD  RFs willing to participate in CR exercise. ThomasAlexandro like to lose weight and learn how to exercise.  Pt with multiple CAD RFs willing to participate in CR exercise. ThomasJhairtarted exercise recently and is tolerating it well.   Pt with multiple CAD RFs willing to participate in CR exercise. ThomasGlenmorenues to do well.  He has great attendance and feels that he is eastblishing a routine for exercise.   Pt with multiple CAD RFs willing to participate in CR exercise. ThomasNassimnues to do well.  His vitals are stable.  ThomasTadariusgraduate soon.   Pt with multiple CAD RFs willing to participate in CR exercise. ThomasTakuyanues to do well.  His vitals are stable.  Nina plans to continue exercising on his own at [employer] in CR [employer] weight in CR [employer].     Expected Outcomes  ThomasMarkusparticipate in CR exercise, nutrition, and lifestyle modification opportunities.   ThomasJohannparticipate in CR exercise, nutrition, and lifestyle modification opportunities.   ThomasElijaparticipate in CR  exercise, nutrition, and lifestyle modification opportunities.   Hridaan will participate in CR exercise, nutrition, and lifestyle modification opportunities.   Laronn will participate in CR exercise, nutrition, and lifestyle modification opportunities.       Exercise Goals and Review: Exercise Goals    Row Name 02/26/18 0805             Exercise Goals   Increase Physical Activity  Yes       Intervention  Provide advice, education, support and counseling about physical activity/exercise needs.;Develop an individualized exercise prescription for aerobic and resistive training based on initial evaluation findings, risk stratification, comorbidities and participant's personal goals.       Expected Outcomes  Short Term: Attend rehab on a regular basis to increase amount of physical activity.;Long Term: Add in home exercise to make exercise part of routine and to  increase amount of physical activity.;Long Term: Exercising regularly at least 3-5 days a week.       Increase Strength and Stamina  Yes       Intervention  Develop an individualized exercise prescription for aerobic and resistive training based on initial evaluation findings, risk stratification, comorbidities and participant's personal goals.;Provide advice, education, support and counseling about physical activity/exercise needs.       Expected Outcomes  Short Term: Increase workloads from initial exercise prescription for resistance, speed, and METs.;Short Term: Perform resistance training exercises routinely during rehab and add in resistance training at home;Long Term: Improve cardiorespiratory fitness, muscular endurance and strength as measured by increased METs and functional capacity (6MWT)       Able to understand and use rate of perceived exertion (RPE) scale  Yes       Intervention  Provide education and explanation on how to use RPE scale       Expected Outcomes  Short Term: Able to use RPE daily in rehab to express subjective intensity level;Long Term:  Able to use RPE to guide intensity level when exercising independently       Knowledge and understanding of Target Heart Rate Range (THRR)  Yes       Intervention  Provide education and explanation of THRR including how the numbers were predicted and where they are located for reference       Expected Outcomes  Long Term: Able to use THRR to govern intensity when exercising independently;Short Term: Able to state/look up THRR;Short Term: Able to use daily as guideline for intensity in rehab       Able to check pulse independently  Yes       Intervention  Provide education and demonstration on how to check pulse in carotid and radial arteries.;Review the importance of being able to check your own pulse for safety during independent exercise       Expected Outcomes  Short Term: Able to explain why pulse checking is important during  independent exercise;Long Term: Able to check pulse independently and accurately       Understanding of Exercise Prescription  Yes       Intervention  Provide education, explanation, and written materials on patient's individual exercise prescription       Expected Outcomes  Short Term: Able to explain program exercise prescription;Long Term: Able to explain home exercise prescription to exercise independently          Nutrition & Weight - Outcomes: Pre Biometrics - 02/26/18 1044      Pre Biometrics   Height  _0  (1.727 m)    Weight  132 kg    Waist Circumference  54.5 inches    Hip Circumference  49.75 inches    Waist to Hip Ratio  1.1 %    BMI (Calculated)  44.26    Triceps Skinfold  27 mm    % Body Fat  43 %    Grip Strength  44 kg    Flexibility  12.5 in    Single Leg Stand  25.49 seconds      Post Biometrics - 05/11/18 1528       Post  Biometrics   Height  _0  (1.727 m)    Weight  121.4 kg    Waist Circumference  52 inches    Hip Circumference  50 inches    Waist to Hip Ratio  1.04 %    BMI (Calculated)  40.7    Triceps Skinfold  25 mm    % Body Fat  40.1 %    Grip Strength  48 kg    Flexibility  13 in    Single Leg Stand  30 seconds       Nutrition: Nutrition Therapy & Goals - 06/05/18 1007      Nutrition Therapy   Diet  heart healthy      Personal Nutrition Goals   Nutrition Goal  Pt to identify food quantities necessary to achieve weight loss of 6-24 lbs. at graduation from cardiac rehab.    Personal Goal #2  Pt to identify and limit food sources of saturated fat, trans fat, and sodium      Intervention Plan   Intervention  Prescribe, educate and counsel regarding individualized specific dietary modifications aiming towards targeted core components such as weight, hypertension, lipid management, diabetes, heart failure and other comorbidities.    Expected Outcomes  Short Term Goal: Understand basic principles of dietary content, such as calories, fat,  sodium, cholesterol and nutrients.       Nutrition Discharge: Nutrition Assessments - 06/05/18 1006      MEDFICTS Scores   Pre Score  30    Post Score  18    Score Difference  -12       Education Questionnaire Score: Knowledge Questionnaire Score - 05/13/18 1628      Knowledge Questionnaire Score   Pre Score  18/24     Post Score  19/24       Goals reviewed with patient; copy given to patient.

## 2018-06-10 NOTE — Addendum Note (Signed)
Encounter addended by: Nikki Dom, RN on: 06/10/2018 2:04 PM  Actions taken: Episode resolved

## 2018-06-10 NOTE — Addendum Note (Signed)
Encounter addended by: Nikki Dom, RN on: 06/10/2018 11:31 AM  Actions taken: Visit Navigator Flowsheet section accepted

## 2018-06-10 NOTE — Addendum Note (Signed)
Encounter addended by: Nikki Dom, RN on: 06/10/2018 1:43 PM  Actions taken: Order Reconciliation Section accessed, Medication List reviewed, Sign clinical note

## 2018-06-12 ENCOUNTER — Encounter (HOSPITAL_COMMUNITY): Payer: BLUE CROSS/BLUE SHIELD

## 2018-06-15 ENCOUNTER — Encounter (HOSPITAL_COMMUNITY): Payer: BLUE CROSS/BLUE SHIELD

## 2018-06-17 ENCOUNTER — Encounter (HOSPITAL_COMMUNITY): Payer: BLUE CROSS/BLUE SHIELD

## 2018-06-19 ENCOUNTER — Encounter (HOSPITAL_COMMUNITY): Payer: BLUE CROSS/BLUE SHIELD

## 2018-06-22 ENCOUNTER — Encounter (HOSPITAL_COMMUNITY): Payer: BLUE CROSS/BLUE SHIELD

## 2018-06-24 ENCOUNTER — Encounter (HOSPITAL_COMMUNITY): Payer: BLUE CROSS/BLUE SHIELD

## 2018-09-10 DIAGNOSIS — G4733 Obstructive sleep apnea (adult) (pediatric): Secondary | ICD-10-CM

## 2018-09-10 HISTORY — DX: Obstructive sleep apnea (adult) (pediatric): G47.33

## 2018-10-12 NOTE — Progress Notes (Signed)
Cardiology Office Note:    Date:  10/14/2018   ID:  Michael Webster, DOB [birth year], MRN [id number]  PCP:  Clide Dales, PA  Cardiologist:  Norman Herrlich, MD    Referring MD: Clide Dales, *    ASSESSMENT:    1. Coronary artery spasm (HCC)   2. Essential hypertension   3. Other hyperlipidemia    PLAN:    In order of problems listed above:  1. Stable has had no recurrent chest pain New [place] Heart Association class I continue current treatment including aspirin statin oral nitrate calcium channel blocker.  I will plan to see back in the office in 1 year 2. Stable home blood pressures run consistently last 30 and continue current treatment including thiazide diuretic and his long-acting calcium channel blocker.  Note he has a mild degree of renal insufficiency potassium is normal 3. Stable with his neuromuscular disease continue his low intensity statin and I think he has had an excellent response to pravastatin and his low-density cholesterol is close to ideal.   Next appointment: 1 year   Medication Adjustments/Labs and Tests Ordered: Current medicines are reviewed at length with the patient today.  Concerns regarding medicines are outlined above.  No orders of the defined types were placed in this encounter.  No orders of the defined types were placed in this encounter.   Chief Complaint  Patient presents with  . Follow-up    for coronary artery spasm    History of Present Illness:    Michael Webster is a 63 y.o. male with a hx of myasthenia gravis NonSTEMI 01/04/19 with normal coronary arteriography presumed coronary artery spasm  hypertension and hyperlipidemia last seen 08/15/19maintained on aspirin CCB imdur and statin.  01/05/18:   Conclusion    The left ventricular systolic function is normal.  LV end diastolic pressure is normal.  The left ventricular ejection fraction is 50-55% by visual estimate.   Normal coronary arteries in a dominant left  circumflex system.  Left main essentially is a common ostium with trifurcate into an LAD, a very high marginal/ramus intermediate like vessel and a dominant left circumflex coronary artery.  The RCA is a nondominant small vessel.  Global LV function with an EF of 50 to 55% without definitive wall motion abnormalities.  RECOMMENDATION: No significant coronary obstructive disease is demonstrated on this catheterization.  Question transient vasospasm in the etiology of the patient's chest pain with troponin elevation.  Medical therapy.    Compliance with diet, lifestyle and medications: Yes  Pleased with the quality of his life is active exercises and has had no angina dyspnea palpitation or syncope recent labs at his PCP office reviewed and his myasthenia gravis is quiet at this time and has not been worsened with statin treatment.  He follows home blood pressures which are consistently less than 130 systolic repeat by me today is upper normal and I will continue his current medical regimen Past Medical History:  Diagnosis Date  . Congestive heart failure (CHF) (HCC)   . Essential hypertension 08/08/2016  . Gastroesophageal reflux disease 02/24/2018  . GI bleed 07/18/2016  . Hypertension   . Myasthenia gravis (HCC)   . NSTEMI (non-ST elevated myocardial infarction) (HCC) 01/03/2018  . Other hyperlipidemia 05/01/2017  . Snoring 03/12/2018  . Stroke [facility])     Past Surgical History:  Procedure Laterality Date  . LAPAROSCOPIC CHOLECYSTECTOMY    . LEFT HEART CATH AND CORONARY ANGIOGRAPHY N/A 01/05/2018   Procedure: LEFT HEART  CATH AND CORONARY ANGIOGRAPHY;  Surgeon: Lennette Bihari, MD;  [facility] INVASIVE CV LAB;  Service: Cardiovascular;  Laterality: N/A;    Current Medications: Current Meds  Medication Sig  . amLODipine (NORVASC) 5 MG tablet Take 1 tablet (5 mg total) by mouth daily.  Marland Kitchen aspirin EC 81 MG tablet Take 1 tablet (81 mg total) by mouth daily.  Marland Kitchen docusate sodium (COLACE) 100  MG capsule Take 100 mg by mouth daily as needed for mild constipation.  . famotidine (PEPCID) 20 MG tablet Take 20 mg by mouth 2 (two) times daily.  . hydrochlorothiazide (HYDRODIURIL) 25 MG tablet Take 1 tablet (25 mg total) by mouth daily.  . isosorbide mononitrate (IMDUR) 30 MG 24 hr tablet Take 1 tablet (30 mg total) by mouth daily.  Marland Kitchen ketotifen (ZADITOR) 0.025 % ophthalmic solution Place 1 drop into both eyes daily as needed.  . mycophenolate (CELLCEPT) 500 MG tablet Take 500 mg by mouth 2 (two) times daily.  . nitroGLYCERIN (NITROSTAT) 0.4 MG SL tablet Place 1 tablet (0.4 mg total) under the tongue every 5 (five) minutes as needed for chest pain.  . pravastatin (PRAVACHOL) 40 MG tablet Take 40 mg by mouth every evening.  . pyridostigmine (MESTINON) 60 MG tablet Take 60 mg by mouth 4 (four) times daily.  Marland Kitchen senna (SENOKOT) 8.6 MG TABS tablet Take 1 tablet by mouth daily as needed for mild constipation.     Allergies:   Acetaminophen; Aminoglycosides; Beta adrenergic blockers; Calcium channel blockers; Magnesium-containing compounds; Naproxen; Other; Penicillamine; Quinine derivatives; Tubocurarine; Calcium carbonate antacid; Erythromycin; Ibuprofen-famotidine; Magnesium hydroxide; and Naproxen sodium   Social History   Socioeconomic History  . Marital status: Widowed    Spouse name: Not on file  . Number of children: Not on file  . Years of education: Not on file  . Highest education level: Not on file  Occupational History  . Not on file  Social Needs  . Financial resource strain: Not on file  . Food insecurity:    Worry: Not on file    Inability: Not on file  . Transportation needs:    Medical: Not on file    Non-medical: Not on file  Tobacco Use  . Smoking status: Never Smoker  . Smokeless tobacco: Never Used  Substance and Sexual Activity  . Alcohol use: Yes    Comment: Occasional use  . Drug use: Never  . Sexual activity: Not on file  Lifestyle  . Physical activity:      Days per week: Not on file    Minutes per session: Not on file  . Stress: Not on file  Relationships  . Social connections:    Talks on phone: Not on file    Gets together: Not on file    Attends religious service: Not on file    Active member of club or organization: Not on file    Attends meetings of clubs or organizations: Not on file    Relationship status: Not on file  Other Topics Concern  . Not on file  Social History Narrative  . Not on file     Family History: The patient's family history includes Breast cancer in his mother; Colon cancer in his father and paternal grandfather; Liver disease in his brother; Stroke in his father. There is no history of Heart attack. ROS:   Please see the history of present illness.    All other systems reviewed and are negative.  EKGs/Labs/Other Studies Reviewed:  The following studies were reviewed today:  EKG:  EKG ordered today and personally reviewed during the visit.  The ekg ordered today demonstrates [facility] LAD Non specific IVCD stable EKG  Recent Labs: 01/03/2018: B Natriuretic Peptide 13.2 01/06/2018: BUN 12; Creatinine, Ser 1.16; Hemoglobin 14.8; Platelets 186; Potassium 4.1; Sodium 141  Recent Lipid Panel   Chol 153 HDL 52 LDL 72 CMP normal except Cr 1.21 08/24/18    Component Value Date/Time   CHOL 172 01/04/2018 0650   TRIG 122 01/04/2018 0650   HDL 54 01/04/2018 0650   CHOLHDL 3.2 01/04/2018 0650   VLDL 24 01/04/2018 0650   LDLCALC 94 01/04/2018 0650    Physical Exam:    VS:  BP 140/90   Pulse 73   Ht  (1.778 m)   Wt 274 lb 4 oz (124.4 kg)   SpO2 97%   BMI 39.35 kg/m     Wt Readings from Last 3 Encounters:  10/14/18 274 lb 4 oz (124.4 kg)  05/11/18 267 lb 10.2 oz (121.4 kg)  04/02/18 279 lb 6.4 oz (126.7 kg)     GEN:  Well nourished, well developed in no acute distress HEENT: Normal NECK: No JVD; No carotid bruits LYMPHATICS: No lymphadenopathy CARDIAC: RRR, no murmurs, rubs,  gallops RESPIRATORY:  Clear to auscultation without rales, wheezing or rhonchi  ABDOMEN: Soft, non-tender, non-distended MUSCULOSKELETAL:  No edema; No deformity  SKIN: Warm and dry NEUROLOGIC:  Alert and oriented x 3 PSYCHIATRIC:  Normal affect    Signed, Norman Herrlich, MD  10/14/2018 8:39 AM    [facility]

## 2018-10-14 ENCOUNTER — Encounter: Payer: Self-pay | Admitting: Cardiology

## 2018-10-14 ENCOUNTER — Ambulatory Visit: Payer: BLUE CROSS/BLUE SHIELD | Admitting: Cardiology

## 2018-10-14 VITALS — BP 140/90 | HR 73 | Ht 70.0 in | Wt 274.2 lb

## 2018-10-14 DIAGNOSIS — E7849 Other hyperlipidemia: Secondary | ICD-10-CM | POA: Diagnosis not present

## 2018-10-14 DIAGNOSIS — I10 Essential (primary) hypertension: Secondary | ICD-10-CM

## 2018-10-14 DIAGNOSIS — I201 Angina pectoris with documented spasm: Secondary | ICD-10-CM

## 2018-10-14 HISTORY — DX: Angina pectoris with documented spasm: I20.1

## 2018-10-14 MED ORDER — ISOSORBIDE MONONITRATE ER 30 MG PO TB24: 30.0000 mg | ORAL_TABLET | Freq: Every day | ORAL | 3 refills | Status: DC

## 2018-10-14 MED ORDER — AMLODIPINE BESYLATE 5 MG PO TABS: 5.0000 mg | ORAL_TABLET | Freq: Every day | ORAL | 3 refills | Status: DC

## 2018-10-14 NOTE — Patient Instructions (Addendum)
Medication Instructions:  Your physician recommends that you continue on your current medications as directed. Please refer to the Current Medication list given to you today.  If you need a refill on your cardiac medications before your next appointment, please call your pharmacy.   Lab work: .NONE  Testing/Procedures: An EKG was performed today  Follow-Up: At [facility], you and your health needs are our priority.  As part of our continuing mission to provide you with exceptional heart care, we have created designated Provider Care Teams.  These Care Teams include your primary Cardiologist (physician) and Advanced Practice Providers (APPs -  Physician Assistants and Nurse Practitioners) who all work together to provide you with the care you need, when you need it. You will need a follow up appointment in 1 years.  Please call our office 2 months in advance to schedule this appointment.     Any Other Special Instructions Will Be Listed Below     [facility]  Tips to measure your blood pressure correctly  To determine whether you have hypertension, a medical professional will take a blood pressure reading. How you prepare for the test, the position of your arm, and other factors can change a blood pressure reading by 10% or more. That could be enough to hide high blood pressure, start you on a drug you don't really need, or lead your doctor to incorrectly adjust your medications. National and international guidelines offer specific instructions for measuring blood pressure. If a doctor, nurse, or medical assistant isn't doing it right, don't hesitate to ask him or her to get with the guidelines. Here's what you can do to ensure a correct reading: . Don't drink a caffeinated beverage or smoke during the 30 minutes before the test. . Sit quietly for five minutes before the test begins. . During the measurement, sit in a chair with your feet on the floor and your arm supported so your  elbow is at about heart level. . The inflatable part of the cuff should completely cover at least 80% of your upper arm, and the cuff should be placed on bare skin, not over a shirt. . Don't talk during the measurement. . Have your blood pressure measured twice, with a brief break in between. If the readings are different by 5 points or more, have it done a third time. There are times to break these rules. If you sometimes feel lightheaded when getting out of bed in the morning or when you stand after sitting, you should have your blood pressure checked while seated and then while standing to see if it falls from one position to the next. Because blood pressure varies throughout the day, your doctor will rarely diagnose hypertension on the basis of a single reading. Instead, he or she will want to confirm the measurements on at least two occasions, usually within a few weeks of one another. The exception to this rule is if you have a blood pressure reading of 180/110 mm Hg or higher. A result this high usually calls for prompt treatment. It's also a good idea to have your blood pressure measured in both arms at least once, since the reading in one arm (usually the right) may be higher than that in the left. A 2014 study in The American Journal of Medicine of nearly 3,400 people found average arm- to-arm differences in systolic blood pressure of about 5 points. The higher number should be used to make treatment decisions. In 2017, new guidelines from the  American Heart Association, the [employer] of Cardiology, and nine other health organizations lowered the diagnosis of high blood pressure to 130/80 mm Hg or higher for all adults. The guidelines also redefined the various blood pressure categories to now include normal, elevated, Stage 1 hypertension, Stage 2 hypertension, and hypertensive crisis (see "Blood pressure categories"). Blood pressure categories  Blood pressure category SYSTOLIC (upper  number)  DIASTOLIC (lower number)  Normal Less than 120 mm Hg and Less than 80 mm Hg  Elevated 120-129 mm Hg and Less than 80 mm Hg  High blood pressure: Stage 1 hypertension 130-139 mm Hg or 80-89 mm Hg  High blood pressure: Stage 2 hypertension 140 mm Hg or higher or 90 mm Hg or higher  Hypertensive crisis (consult your doctor immediately) Higher than 180 mm Hg and/or Higher than 120 mm Hg  Source: American Heart Association and American Stroke Association. For more on getting your blood pressure under control, buy Controlling Your Blood Pressure, a Special Health Report from [facility].

## 2019-01-05 DIAGNOSIS — H532 Diplopia: Secondary | ICD-10-CM | POA: Diagnosis not present

## 2019-01-05 DIAGNOSIS — G5603 Carpal tunnel syndrome, bilateral upper limbs: Secondary | ICD-10-CM | POA: Diagnosis not present

## 2019-01-05 DIAGNOSIS — G7000 Myasthenia gravis without (acute) exacerbation: Secondary | ICD-10-CM | POA: Diagnosis not present

## 2019-01-05 DIAGNOSIS — M5412 Radiculopathy, cervical region: Secondary | ICD-10-CM | POA: Diagnosis not present

## 2019-01-05 DIAGNOSIS — G603 Idiopathic progressive neuropathy: Secondary | ICD-10-CM | POA: Diagnosis not present

## 2019-01-05 DIAGNOSIS — H02409 Unspecified ptosis of unspecified eyelid: Secondary | ICD-10-CM | POA: Diagnosis not present

## 2019-01-05 DIAGNOSIS — Z79899 Other long term (current) drug therapy: Secondary | ICD-10-CM | POA: Diagnosis not present

## 2019-02-18 DIAGNOSIS — I10 Essential (primary) hypertension: Secondary | ICD-10-CM | POA: Diagnosis not present

## 2019-02-18 DIAGNOSIS — E7849 Other hyperlipidemia: Secondary | ICD-10-CM | POA: Diagnosis not present

## 2019-02-18 DIAGNOSIS — Z125 Encounter for screening for malignant neoplasm of prostate: Secondary | ICD-10-CM | POA: Diagnosis not present

## 2019-03-05 DIAGNOSIS — I10 Essential (primary) hypertension: Secondary | ICD-10-CM | POA: Diagnosis not present

## 2019-03-05 DIAGNOSIS — E7849 Other hyperlipidemia: Secondary | ICD-10-CM | POA: Diagnosis not present

## 2019-04-20 IMAGING — CT CT ANGIO CHEST
2 of 8 series · 18 of 46 positions shown · IV contrast (APPLIED)
Comparison: None.

CLINICAL DATA: Shortness of Breath

EXAM:
CT ANGIOGRAPHY CHEST WITH CONTRAST
TECHNIQUE: Multidetector CT imaging of the chest was performed using the
standard protocol during bolus administration of intravenous
contrast. Multiplanar CT image reconstructions and MIPs were
obtained to evaluate the vascular anatomy.
CONTRAST:  100mL PTR9QP-667 IOPAMIDOL (PTR9QP-667) INJECTION 76%

[Series 6: thins · axial · 0.87mm/px · z∈[+1186,+1432]mm · 15 of 272 slices shown]
[im 13/272  lung]
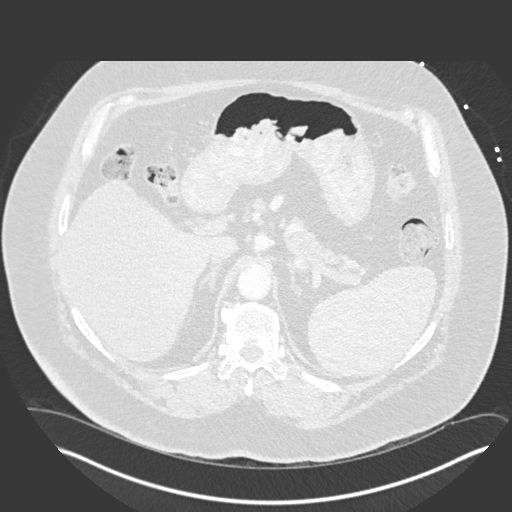
[im 37/272  soft-tissue]
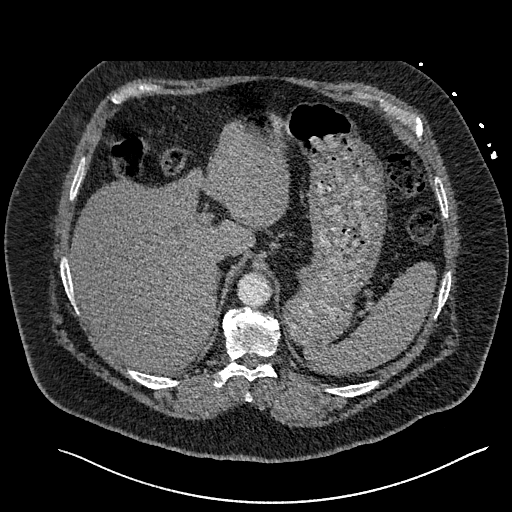
[im 50/272  lung]
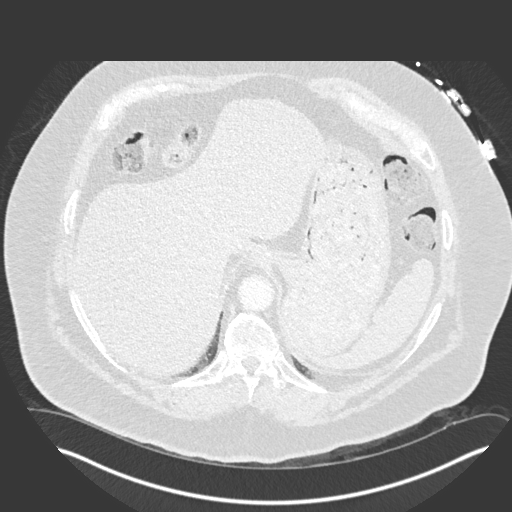
[im 62/272  soft-tissue]
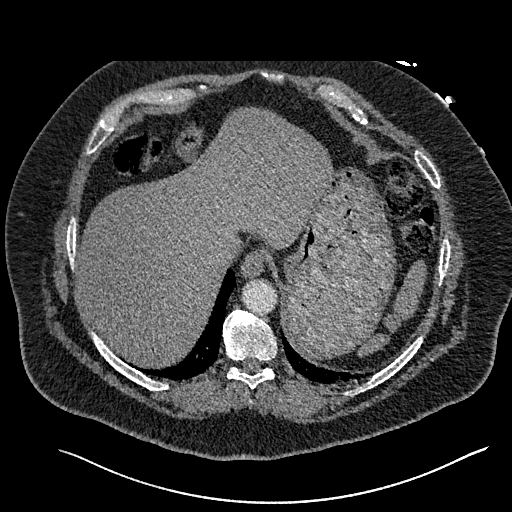
[im 87/272  lung]
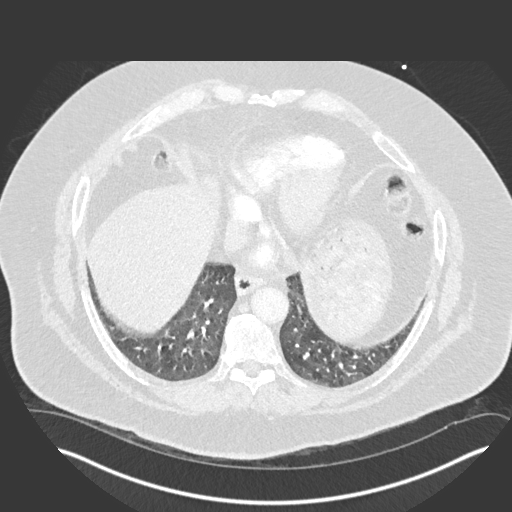
[im 99/272  soft-tissue]
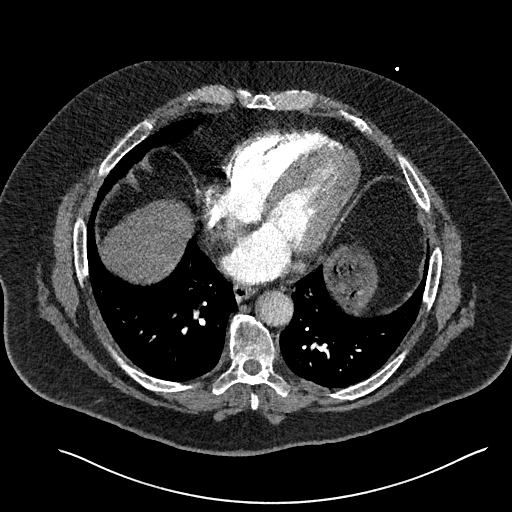
[im 124/272  lung]
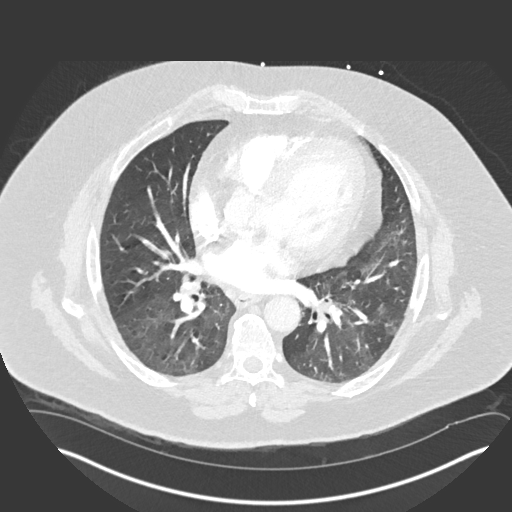
[im 136/272  soft-tissue]
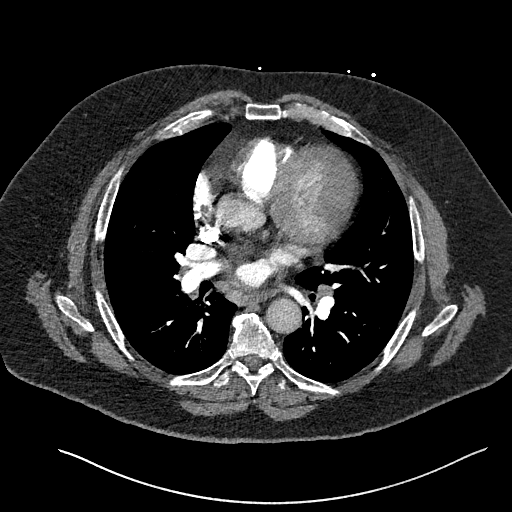
[im 148/272  lung]
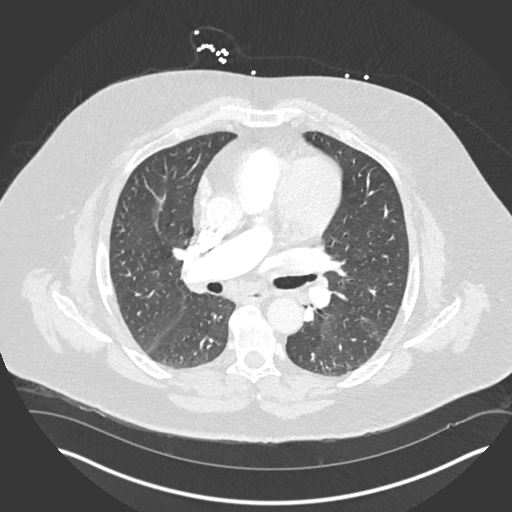
[im 173/272  soft-tissue]
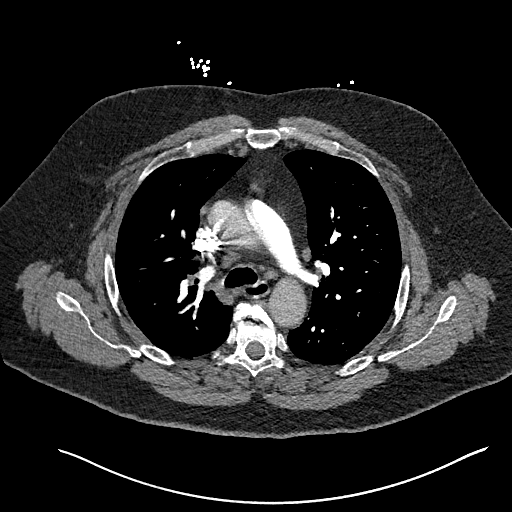
[im 185/272  lung]
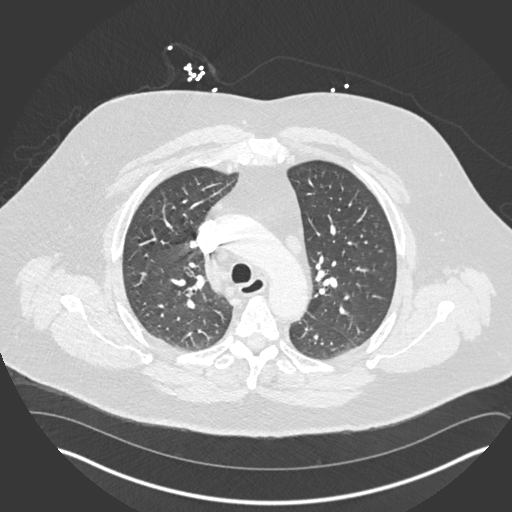
[im 210/272  soft-tissue]
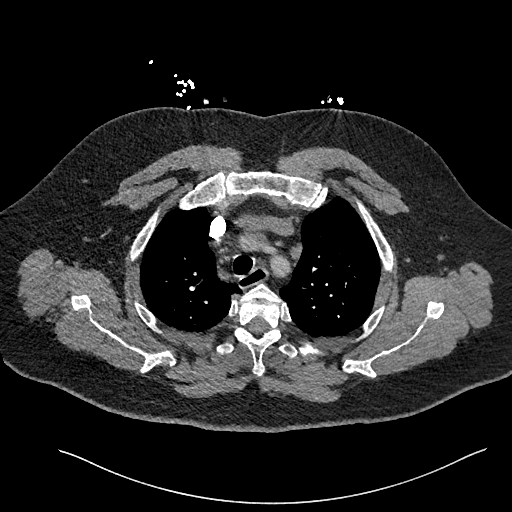
[im 222/272  lung]
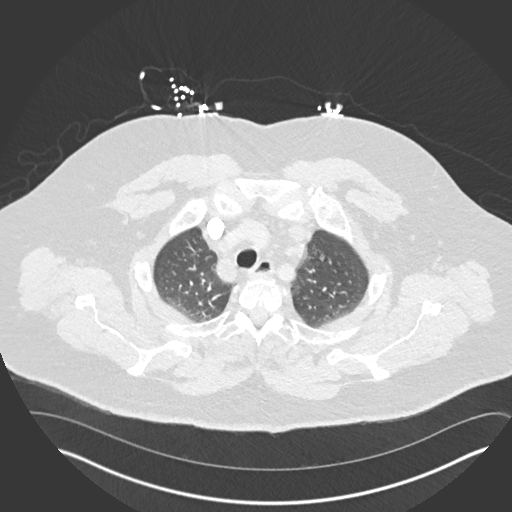
[im 235/272  soft-tissue]
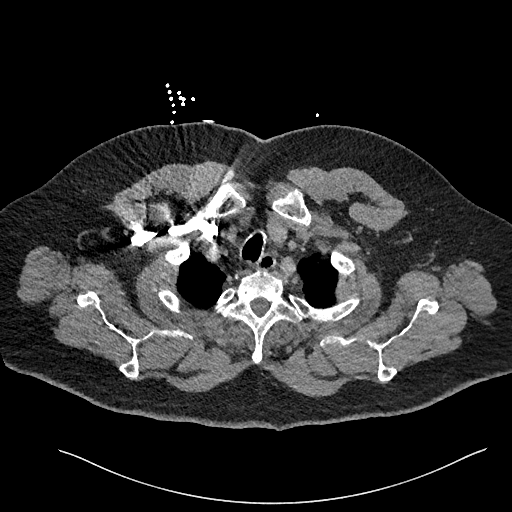
[im 259/272  lung]
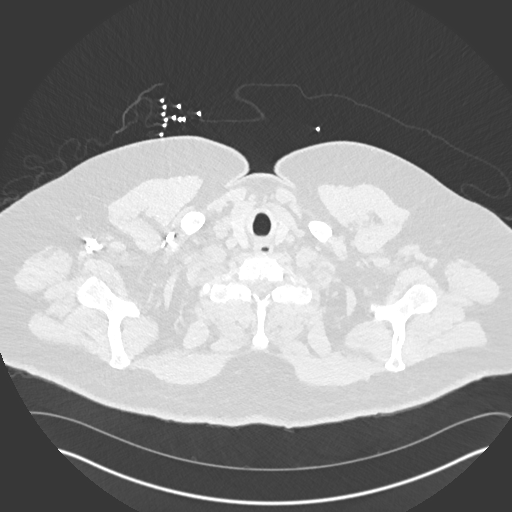

[Series 8: coronal mpr · coronal · 0.59mm/px · 3 of 151 slices shown]
[im 38/151  soft-tissue]
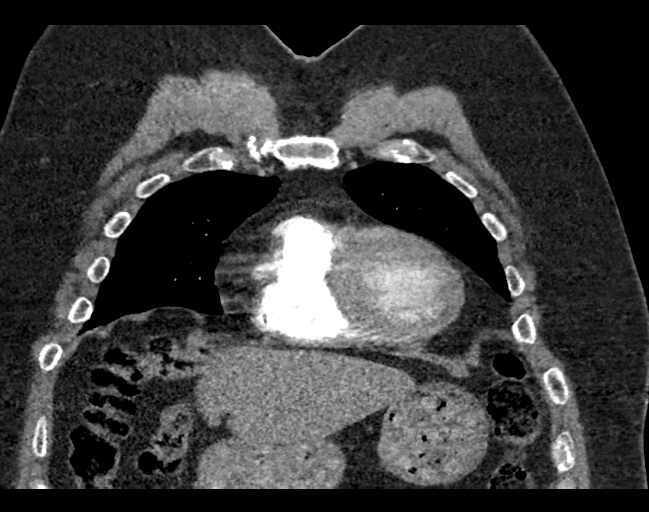
[im 76/151  soft-tissue]
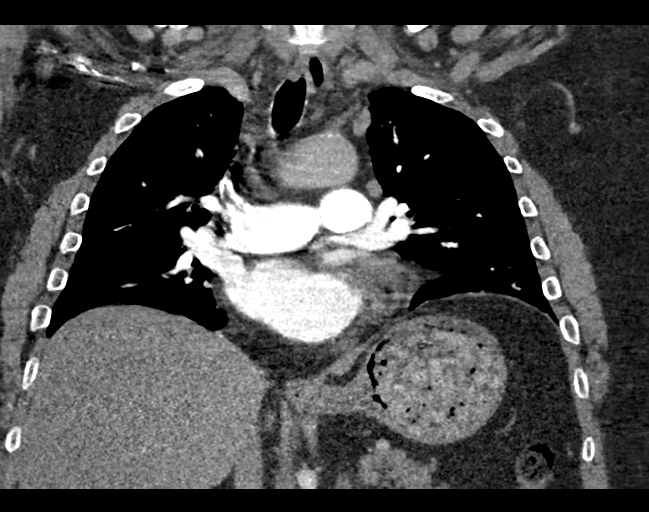
[im 113/151  soft-tissue]
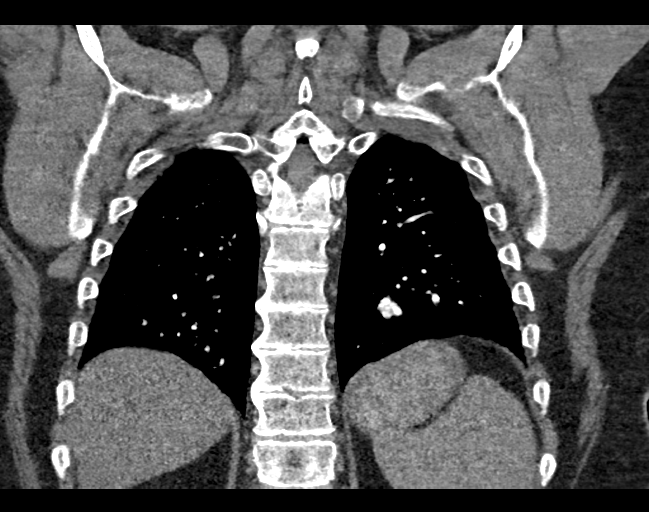

[18 of 46 positions shown; findings below may reference images not displayed]

FINDINGS: Cardiovascular: Heart is normal size. Aorta is normal caliber.
Scattered aortic calcifications. No filling defects in the pulmonary
arteries to suggest pulmonary emboli.

Mediastinum/Nodes: No mediastinal, hilar, or axillary adenopathy.

Lungs/Pleura: Lungs are clear. No focal airspace opacities or
suspicious nodules. No effusions.

Upper Abdomen: Imaging into the upper abdomen shows no acute
findings.

Musculoskeletal: Chest wall soft tissues are unremarkable. No acute
bony abnormality.

Review of the MIP images confirms the above findings.
IMPRESSION: No evidence of pulmonary embolus.

No acute cardiopulmonary disease.

## 2019-05-28 DIAGNOSIS — Z23 Encounter for immunization: Secondary | ICD-10-CM | POA: Diagnosis not present

## 2019-07-06 DIAGNOSIS — G603 Idiopathic progressive neuropathy: Secondary | ICD-10-CM | POA: Diagnosis not present

## 2019-07-06 DIAGNOSIS — I10 Essential (primary) hypertension: Secondary | ICD-10-CM | POA: Diagnosis not present

## 2019-07-06 DIAGNOSIS — H02409 Unspecified ptosis of unspecified eyelid: Secondary | ICD-10-CM | POA: Diagnosis not present

## 2019-07-06 DIAGNOSIS — G7001 Myasthenia gravis with (acute) exacerbation: Secondary | ICD-10-CM | POA: Diagnosis not present

## 2019-07-06 DIAGNOSIS — G5603 Carpal tunnel syndrome, bilateral upper limbs: Secondary | ICD-10-CM | POA: Diagnosis not present

## 2019-07-06 DIAGNOSIS — H532 Diplopia: Secondary | ICD-10-CM | POA: Diagnosis not present

## 2019-07-06 DIAGNOSIS — M5412 Radiculopathy, cervical region: Secondary | ICD-10-CM | POA: Diagnosis not present

## 2019-08-31 DIAGNOSIS — I10 Essential (primary) hypertension: Secondary | ICD-10-CM | POA: Diagnosis not present

## 2019-08-31 DIAGNOSIS — E785 Hyperlipidemia, unspecified: Secondary | ICD-10-CM | POA: Diagnosis not present

## 2019-09-07 DIAGNOSIS — I10 Essential (primary) hypertension: Secondary | ICD-10-CM | POA: Diagnosis not present

## 2019-09-07 DIAGNOSIS — E7849 Other hyperlipidemia: Secondary | ICD-10-CM | POA: Diagnosis not present

## 2019-09-07 DIAGNOSIS — R0989 Other specified symptoms and signs involving the circulatory and respiratory systems: Secondary | ICD-10-CM | POA: Diagnosis not present

## 2019-09-21 DIAGNOSIS — I6522 Occlusion and stenosis of left carotid artery: Secondary | ICD-10-CM | POA: Diagnosis not present

## 2019-09-21 DIAGNOSIS — I6523 Occlusion and stenosis of bilateral carotid arteries: Secondary | ICD-10-CM | POA: Diagnosis not present

## 2019-09-28 ENCOUNTER — Encounter: Payer: Self-pay | Admitting: Cardiology

## 2019-09-28 ENCOUNTER — Ambulatory Visit (INDEPENDENT_AMBULATORY_CARE_PROVIDER_SITE_OTHER): Payer: PPO | Admitting: Cardiology

## 2019-09-28 ENCOUNTER — Other Ambulatory Visit: Payer: Self-pay

## 2019-09-28 ENCOUNTER — Encounter: Payer: Self-pay | Admitting: Family

## 2019-09-28 VITALS — BP 144/94 | HR 71 | Temp 97.0°F | Ht 70.0 in | Wt 294.1 lb

## 2019-09-28 DIAGNOSIS — I10 Essential (primary) hypertension: Secondary | ICD-10-CM

## 2019-09-28 DIAGNOSIS — I201 Angina pectoris with documented spasm: Secondary | ICD-10-CM

## 2019-09-28 DIAGNOSIS — G7000 Myasthenia gravis without (acute) exacerbation: Secondary | ICD-10-CM | POA: Diagnosis not present

## 2019-09-28 DIAGNOSIS — E7849 Other hyperlipidemia: Secondary | ICD-10-CM | POA: Diagnosis not present

## 2019-09-28 DIAGNOSIS — I6523 Occlusion and stenosis of bilateral carotid arteries: Secondary | ICD-10-CM

## 2019-09-28 NOTE — Patient Instructions (Signed)
Medication Instructions:  Your physician recommends that you continue on your current medications as directed. Please refer to the Current Medication list given to you today.  *If you need a refill on your cardiac medications before your next appointment, please call your pharmacy*  Lab Work: None ordered   If you have labs (blood work) drawn today and your tests are completely normal, you will receive your results only by: . MyChart Message (if you have MyChart) OR . A paper copy in the mail If you have any lab test that is abnormal or we need to change your treatment, we will call you to review the results.  Testing/Procedures: None ordered   Follow-Up: At [facility], you and your health needs are our priority.  As part of our continuing mission to provide you with exceptional heart care, we have created designated Provider Care Teams.  These Care Teams include your primary Cardiologist (physician) and Advanced Practice Providers (APPs -  Physician Assistants and Nurse Practitioners) who all work together to provide you with the care you need, when you need it.  Your next appointment:   12 month(s)  The format for your next appointment:   In Person  Provider:   Brian Munley, MD  Other Instructions None   

## 2019-09-28 NOTE — Progress Notes (Signed)
Cardiology Office Note:    Date:  09/28/2019   ID:  Michael Webster, DOB 1956/03/02, MRN [id number]  PCP:  Fortino Sic, PA  Cardiologist:  Shirlee More, MD    Referring MD: Fortino Sic, *    ASSESSMENT:    1. Coronary artery spasm (HCC)   2. Other hyperlipidemia   3. Myasthenia gravis ([place])   4. Essential hypertension   5. Atherosclerosis of both carotid arteries    PLAN:    In order of problems listed above:  1. Stable no recurrence continue treatment including aspirin calcium channel blocker oral nitrate and a low intensity statin with his myasthenia gravis.  New York Heart Association class I. 2. Stable lipids are ideal continue low intensity statin with his neuromuscular disease 3. Has done well and is not having symptoms of limitation for myasthenia gravis at this time 4. P at target repeat by me 136/88 strongly encourage weight loss that should bring him into better control and for now continue thiazide diuretic with normal renal function potassium along with his calcium channel blocker 5. Mild risk of stroke is quite low and continue prevention including aspirin statin   Next appointment: 1 year   Medication Adjustments/Labs and Tests Ordered: Current medicines are reviewed at length with the patient today.  Concerns regarding medicines are outlined above.  Orders Placed This Encounter  Procedures  . EKG 12-Lead   No orders of the defined types were placed in this encounter.   Chief Complaint  Patient presents with  . Follow-up  . Coronary Artery Disease    History of Present Illness:    Michael Webster is a 64 y.o. male with a hx of  myasthenia gravis NonSTEMI 01/04/19 with normal coronary arteriography presumed coronary artery spasm  hypertension and hyperlipidemia last seen 10/14/2018.  Is noted to have right-sided carotid bruit on office evaluation [facility] 09/07/2019 and had a duplex performed showing very mild bilateral ICA  atherosclerosis. Compliance with diet, lifestyle and medications: Yes  He was noticed to have a right carotid bruit duplex shows minimal atherosclerosis.  He is enthusiastic committed to weight loss feeling good has had no angina dyspnea palpitation or syncope recent labs show normal liver renal function and lipids are at target on his current statin. Past Medical History:  Diagnosis Date  . Congestive heart failure (CHF) ([place])   . Essential hypertension 08/08/2016  . Gastroesophageal reflux disease 02/24/2018  . GI bleed 07/18/2016  . Hypertension   . Myasthenia gravis ([place])   . NSTEMI (non-ST elevated myocardial infarction) ([place]) 01/03/2018  . Other hyperlipidemia 05/01/2017  . Snoring 03/12/2018  . Stroke [facility])     Past Surgical History:  Procedure Laterality Date  . LAPAROSCOPIC CHOLECYSTECTOMY    . LEFT HEART CATH AND CORONARY ANGIOGRAPHY N/A 01/05/2018   Procedure: LEFT HEART CATH AND CORONARY ANGIOGRAPHY;  Surgeon: Troy Sine, MD;  Location: [facility];  Service: Cardiovascular;  Laterality: N/A;    Current Medications: Current Meds  Medication Sig  . amLODipine (NORVASC) 5 MG tablet Take 1 tablet (5 mg total) by mouth daily.  Marland Kitchen aspirin EC 81 MG tablet Take 1 tablet (81 mg total) by mouth daily.  Marland Kitchen docusate sodium (COLACE) 100 MG capsule Take 100 mg by mouth daily as needed for mild constipation.  . famotidine (PEPCID) 20 MG tablet Take 20 mg by mouth 2 (two) times daily.  . hydrochlorothiazide (HYDRODIURIL) 25 MG tablet Take 1 tablet (25 mg total) by  mouth daily.  . isosorbide mononitrate (IMDUR) 30 MG 24 hr tablet Take 1 tablet (30 mg total) by mouth daily.  . mycophenolate (CELLCEPT) 500 MG tablet Take 500 mg by mouth 2 (two) times daily.  . nitroGLYCERIN (NITROSTAT) 0.4 MG SL tablet Place 1 tablet (0.4 mg total) under the tongue every 5 (five) minutes as needed for chest pain.  . pravastatin (PRAVACHOL) 40 MG tablet Take 40 mg by mouth every evening.  .  pyridostigmine (MESTINON) 60 MG tablet Take 60 mg by mouth 4 (four) times daily.  Marland Kitchen senna (SENOKOT) 8.6 MG TABS tablet Take 1 tablet by mouth daily as needed for mild constipation.     Allergies:   Acetaminophen, Aminoglycosides, Beta adrenergic blockers, Calcium channel blockers, Magnesium-containing compounds, Naproxen, Other, Penicillamine, Quinine derivatives, Tubocurarine, Calcium carbonate antacid, Erythromycin, Ibuprofen-famotidine, Magnesium hydroxide, and Naproxen sodium   Social History   Socioeconomic History  . Marital status: Widowed    Spouse name: Not on file  . Number of children: Not on file  . Years of education: Not on file  . Highest education level: Not on file  Occupational History  . Not on file  Tobacco Use  . Smoking status: Never Smoker  . Smokeless tobacco: Never Used  Substance and Sexual Activity  . Alcohol use: Yes    Comment: Occasional use  . Drug use: Never  . Sexual activity: Not Currently  Other Topics Concern  . Not on file  Social History Narrative  . Not on file   Social Determinants of Health   Financial Resource Strain:   . Difficulty of Paying Living Expenses: Not on file  Food Insecurity:   . Worried About Programme researcher, broadcasting/film/video in the Last Year: Not on file  . Ran Out of Food in the Last Year: Not on file  Transportation Needs:   . Lack of Transportation (Medical): Not on file  . Lack of Transportation (Non-Medical): Not on file  Physical Activity:   . Days of Exercise per Week: Not on file  . Minutes of Exercise per Session: Not on file  Stress:   . Feeling of Stress : Not on file  Social Connections:   . Frequency of Communication with Friends and Family: Not on file  . Frequency of Social Gatherings with Friends and Family: Not on file  . Attends Religious Services: Not on file  . Active Member of Clubs or Organizations: Not on file  . Attends Banker Meetings: Not on file  . Marital Status: Not on file      Family History: The patient's family history includes Breast cancer in his mother; Colon cancer in his father and paternal grandfather; Liver disease in his brother; Stroke in his father. There is no history of Heart attack. ROS:   Please see the history of present illness.    All other systems reviewed and are negative.  EKGs/Labs/Other Studies Reviewed:    The following studies were reviewed today:  EKG:  EKG ordered today and personally reviewed.  The ekg ordered today demonstrates sinus rhythm left axis deviation left anterior hemiblock  Carotid artery duplex performed [facility] 09/21/2019: Mild bilateral ICA atherosclerosis Right Findings No significant atherosclerosis or stenosis of the right common carotid artery. The Doppler flow velocities within the right external carotid artery are within normal limits. Right ICA 1-39% stenosis. Antegrade flow is noted in the right vertebral artery. High resistance waveforms throuought the carotid and vertebral system.  Left Findings No significant  atherosclerosis or stenosis of the left common carotid artery. The Doppler flow velocities within the left external carotid artery are within normal limits. 1-39% stenosis of the left internal carotid artery/bifurcation. Antegrade flow is noted in the left vertebral artery. High resistance waveforms throuought the carotid and vertebral system. Recent Labs:  08/31/2019: CMP normal including liver function test GFR 70 cc Cholesterol 154 HDL 53 LDL 73 non-HDL cholesterol 101 triglyceride 138 CBC normal hemoglobin 14.8 Recent Lipid Panel    Component Value Date/Time   CHOL 172 01/04/2018 0650   TRIG 122 01/04/2018 0650   HDL 54 01/04/2018 0650   CHOLHDL 3.2 01/04/2018 0650   VLDL 24 01/04/2018 0650   LDLCALC 94 01/04/2018 0650    Physical Exam:    VS:  BP (!) 144/94   Pulse 71   Temp (!) 97 F (36.1 C)   Ht 5\' 10"  (1.778 m)   Wt 294 lb 1.3 oz (133.4 kg)   SpO2 97%    BMI 42.20 kg/m     Wt Readings from Last 3 Encounters:  09/28/19 294 lb 1.3 oz (133.4 kg)  10/14/18 274 lb 4 oz (124.4 kg)  05/11/18 267 lb 10.2 oz (121.4 kg)     GEN: Obese well nourished, well developed in no acute distress HEENT: Normal NECK: No JVD; No carotid bruits LYMPHATICS: No lymphadenopathy CARDIAC: RRR, no murmurs, rubs, gallops RESPIRATORY:  Clear to auscultation without rales, wheezing or rhonchi  ABDOMEN: Soft, non-tender, non-distended MUSCULOSKELETAL:  No edema; No deformity  SKIN: Warm and dry NEUROLOGIC:  Alert and oriented x 3 PSYCHIATRIC:  Normal affect    Signed, 05/13/18, MD  09/28/2019 2:35 PM    [facility]

## 2019-10-02 ENCOUNTER — Other Ambulatory Visit: Payer: Self-pay | Admitting: Cardiology

## 2019-10-10 ENCOUNTER — Other Ambulatory Visit: Payer: Self-pay | Admitting: Cardiology

## 2020-01-04 DIAGNOSIS — R201 Hypoesthesia of skin: Secondary | ICD-10-CM | POA: Diagnosis not present

## 2020-01-04 DIAGNOSIS — R202 Paresthesia of skin: Secondary | ICD-10-CM | POA: Diagnosis not present

## 2020-01-04 DIAGNOSIS — G5603 Carpal tunnel syndrome, bilateral upper limbs: Secondary | ICD-10-CM | POA: Diagnosis not present

## 2020-01-04 DIAGNOSIS — H02409 Unspecified ptosis of unspecified eyelid: Secondary | ICD-10-CM | POA: Diagnosis not present

## 2020-01-04 DIAGNOSIS — M5412 Radiculopathy, cervical region: Secondary | ICD-10-CM | POA: Diagnosis not present

## 2020-01-04 DIAGNOSIS — G603 Idiopathic progressive neuropathy: Secondary | ICD-10-CM | POA: Diagnosis not present

## 2020-01-04 DIAGNOSIS — G7000 Myasthenia gravis without (acute) exacerbation: Secondary | ICD-10-CM | POA: Diagnosis not present

## 2020-02-01 ENCOUNTER — Other Ambulatory Visit: Payer: Self-pay | Admitting: Cardiology

## 2020-02-01 MED ORDER — AMLODIPINE BESYLATE 5 MG PO TABS: 5.0000 mg | ORAL_TABLET | Freq: Every day | ORAL | 3 refills | Status: DC

## 2020-02-01 NOTE — Telephone Encounter (Signed)
Refill sent in per request.  

## 2020-02-01 NOTE — Telephone Encounter (Signed)
*  STAT* If patient is at the pharmacy, call can be transferred to refill team.   1. Which medications need to be refilled? (please list name of each medication and dose if known) Amlodipine  2. Which pharmacy/location (including street and city if local pharmacy) is medication to be sent to [facility], ? [place], [place]  3. Do they need a 30 day or 90 day supply? 90 days and refills

## 2020-06-29 ENCOUNTER — Other Ambulatory Visit: Payer: Self-pay | Admitting: Cardiology

## 2020-06-29 MED ORDER — ISOSORBIDE MONONITRATE ER 30 MG PO TB24: 30.0000 mg | ORAL_TABLET | Freq: Every day | ORAL | 0 refills | Status: DC

## 2020-06-29 NOTE — Telephone Encounter (Signed)
Refill of isosorbide mononitrate sent to [facility]

## 2020-06-29 NOTE — Telephone Encounter (Signed)
*  STAT* If patient is at the pharmacy, call can be transferred to refill team.   1. Which medications need to be refilled? (please list name of each medication and dose if known) isosorbide mononitrate (IMDUR) 30 MG 24 hr tablet  2. Which pharmacy/location (including street and city if local pharmacy) is medication to be sent to? [facility] - [place], [place] - [address]  3. Do they need a 30 day or 90 day supply? 90 day supply

## 2020-06-30 ENCOUNTER — Other Ambulatory Visit: Payer: Self-pay | Admitting: Cardiology

## 2020-07-25 ENCOUNTER — Other Ambulatory Visit: Payer: Self-pay | Admitting: Cardiology

## 2020-07-25 NOTE — Telephone Encounter (Signed)
Rx refill sent to pharmacy. 

## 2020-10-03 DIAGNOSIS — I509 Heart failure, unspecified: Secondary | ICD-10-CM | POA: Insufficient documentation

## 2020-10-03 DIAGNOSIS — I10 Essential (primary) hypertension: Secondary | ICD-10-CM | POA: Insufficient documentation

## 2020-10-03 DIAGNOSIS — I639 Cerebral infarction, unspecified: Secondary | ICD-10-CM | POA: Insufficient documentation

## 2020-10-09 NOTE — Progress Notes (Unsigned)
Cardiology Office Note:    Date:  10/10/2020   ID:  Michael Webster, DOB 1955-10-23, MRN [id number]  PCP:  Gillian Scarce, MD  Cardiologist:  Norman Herrlich, MD    Referring MD: Clide Dales, *    ASSESSMENT:    1. Coronary artery spasm (HCC)   2. Essential hypertension   3. Other hyperlipidemia   4. Myasthenia gravis (HCC)    PLAN:    In order of problems listed above:  1. Macallan continues to do well he has had no recurrent angina with his presumed coronary artery spasm we will continue aspirin for antiplatelet therapy a low intensity statin oral nitrate and rate limiting calcium channel blocker. 2. BP at target continue current treatment 3. Ideal lipids continue his low intensity statin 4. Stable myasthenia managed by his neurologist 5. Stable pattern left bundle branch block   Next appointment: 1 year   Medication Adjustments/Labs and Tests Ordered: Current medicines are reviewed at length with the patient today.  Concerns regarding medicines are outlined above.  No orders of the defined types were placed in this encounter.  No orders of the defined types were placed in this encounter.   Chief Complaint  Patient presents with  . Follow-up    Coronary artery spasm with non-ST elevation MI 01/04/2019,[facility]    History of Present Illness:    Michael Webster is a 65 y.o. male with a hx of myasthenia gravis, non-ST elevation MI 01/04/2019 with normal coronary arteriography and presumed coronary artery spasm, hypertension hyper lipidemia and mild bilateral ICA atherosclerosis last seen 09/28/2019. Compliance with diet, lifestyle and medications: Yes  Recent labs [facility] 09/13/2020: CMP normal except glucose 107 GFR 74 cc potassium 3.9 normal liver function test Cholesterol at target 167 LDL 77 triglycerides 181 HDL 54  He continues to do well active goes to the gym good healthy lifestyle has had no chest pain angina shortness of breath palpitations  syncope or edema. His myasthenia gravis is stable. Past Medical History:  Diagnosis Date  . Congestive heart failure (CHF) (HCC)   . Essential hypertension 08/08/2016  . Gastroesophageal reflux disease 02/24/2018  . GI bleed 07/18/2016  . Hypertension   . Myasthenia gravis (HCC)   . NSTEMI (non-ST elevated myocardial infarction) (HCC) 01/03/2018  . Other hyperlipidemia 05/01/2017  . Snoring 03/12/2018  . Stroke  [facility])     Past Surgical History:  Procedure Laterality Date  . LAPAROSCOPIC CHOLECYSTECTOMY    . LEFT HEART CATH AND CORONARY ANGIOGRAPHY N/A 01/05/2018   Procedure: LEFT HEART CATH AND CORONARY ANGIOGRAPHY;  Surgeon: Lennette Bihari, MD;  Location: [facility];  Service: Cardiovascular;  Laterality: N/A;    Current Medications: Current Meds  Medication Sig  . amLODipine (NORVASC) 5 MG tablet Take 1 tablet (5 mg total) by mouth daily.  Marland Kitchen aspirin EC 81 MG tablet Take 1 tablet (81 mg total) by mouth daily.  Marland Kitchen docusate sodium (COLACE) 100 MG capsule Take 100 mg by mouth daily as needed for mild constipation.  . famotidine (PEPCID) 20 MG tablet Take 20 mg by mouth 2 (two) times daily.  . hydrochlorothiazide (HYDRODIURIL) 25 MG tablet Take 1 tablet (25 mg total) by mouth daily.  . isosorbide mononitrate (IMDUR) 30 MG 24 hr tablet TAKE ONE TABLET BY MOUTH DAILY  . mycophenolate (CELLCEPT) 500 MG tablet Take 500 mg by mouth 2 (two) times daily.  . nitroGLYCERIN (NITROSTAT) 0.4 MG SL tablet DISSOLVE 1 TAB UNDER TONGUE FOR CHEST PAIN -  IF PAIN REMAINS AFTER 5 MIN, CALL 911 AND REPEAT DOSE. MAX 3 TABS IN 15 MINUTES  . pravastatin (PRAVACHOL) 40 MG tablet Take 40 mg by mouth every evening.  . pyridostigmine (MESTINON) 60 MG tablet Take 60 mg by mouth 4 (four) times daily.  Marland Kitchen senna (SENOKOT) 8.6 MG TABS tablet Take 1 tablet by mouth daily as needed for mild constipation.     Allergies:   Acetaminophen, Aminoglycosides, Beta adrenergic blockers, Calcium channel blockers,  Magnesium-containing compounds, Naproxen, Other, Penicillamine, Quinine derivatives, Tubocurarine, Calcium carbonate antacid, Erythromycin, Ibuprofen-famotidine, Magnesium hydroxide, and Naproxen sodium   Social History   Socioeconomic History  . Marital status: Widowed    Spouse name: Not on file  . Number of children: Not on file  . Years of education: Not on file  . Highest education level: Not on file  Occupational History  . Not on file  Tobacco Use  . Smoking status: Never Smoker  . Smokeless tobacco: Never Used  Vaping Use  . Vaping Use: Never used  Substance and Sexual Activity  . Alcohol use: Yes    Comment: Occasional use  . Drug use: Never  . Sexual activity: Not Currently  Other Topics Concern  . Not on file  Social History Narrative  . Not on file   Social Determinants of Health   Financial Resource Strain: Not on file  Food Insecurity: Not on file  Transportation Needs: Not on file  Physical Activity: Not on file  Stress: Not on file  Social Connections: Not on file     Family History: The patient's family history includes Breast cancer in his mother; Colon cancer in his father and paternal grandfather; Liver disease in his brother; Stroke in his father. There is no history of Heart attack. ROS:   Please see the history of present illness.    All other systems reviewed and are negative.  EKGs/Labs/Other Studies Reviewed:    The following studies were reviewed today:  EKG:  EKG ordered today and personally reviewed.  The ekg ordered today demonstrates sinus rhythm left bundle branch block unchanged from 09/28/2019  Recent Labs: No results found for requested labs within last 8760 hours.  Recent Lipid Panel    Component Value Date/Time   CHOL 172 01/04/2018 0650   TRIG 122 01/04/2018 0650   HDL 54 01/04/2018 0650   CHOLHDL 3.2 01/04/2018 0650   VLDL 24 01/04/2018 0650   LDLCALC 94 01/04/2018 0650    Physical Exam:    VS:  BP 136/72   Pulse  71   Ht 5\' 10"  (1.778 m)   Wt 279 lb 6.4 oz (126.7 kg)   SpO2 95%   BMI 40.09 kg/m     Wt Readings from Last 3 Encounters:  10/10/20 279 lb 6.4 oz (126.7 kg)  09/28/19 294 lb 1.3 oz (133.4 kg)  10/14/18 274 lb 4 oz (124.4 kg)     GEN:  Well nourished, well developed in no acute distress HEENT: Normal NECK: No JVD; No carotid bruits LYMPHATICS: No lymphadenopathy CARDIAC: RRR, no murmurs, rubs, gallops RESPIRATORY:  Clear to auscultation without rales, wheezing or rhonchi  ABDOMEN: Soft, non-tender, non-distended MUSCULOSKELETAL:  No edema; No deformity  SKIN: Warm and dry NEUROLOGIC:  Alert and oriented x 3 PSYCHIATRIC:  Normal affect    Signed, 10/16/18, MD  10/10/2020 8:34 AM    [facility]

## 2020-10-10 ENCOUNTER — Ambulatory Visit: Payer: Medicare HMO | Admitting: Cardiology

## 2020-10-10 ENCOUNTER — Other Ambulatory Visit: Payer: Self-pay

## 2020-10-10 ENCOUNTER — Encounter: Payer: Self-pay | Admitting: Cardiology

## 2020-10-10 VITALS — BP 136/72 | HR 71 | Ht 70.0 in | Wt 279.4 lb

## 2020-10-10 DIAGNOSIS — E7849 Other hyperlipidemia: Secondary | ICD-10-CM | POA: Diagnosis not present

## 2020-10-10 DIAGNOSIS — I201 Angina pectoris with documented spasm: Secondary | ICD-10-CM

## 2020-10-10 DIAGNOSIS — I10 Essential (primary) hypertension: Secondary | ICD-10-CM

## 2020-10-10 DIAGNOSIS — G7000 Myasthenia gravis without (acute) exacerbation: Secondary | ICD-10-CM | POA: Diagnosis not present

## 2020-10-10 MED ORDER — NITROGLYCERIN 0.4 MG SL SUBL: SUBLINGUAL_TABLET | SUBLINGUAL | 3 refills | Status: DC

## 2020-10-10 MED ORDER — ISOSORBIDE MONONITRATE ER 30 MG PO TB24: 30.0000 mg | ORAL_TABLET | Freq: Every day | ORAL | 3 refills | Status: DC

## 2020-10-10 NOTE — Patient Instructions (Signed)

## 2021-01-20 ENCOUNTER — Other Ambulatory Visit: Payer: Self-pay | Admitting: Cardiology

## 2021-04-25 HISTORY — DX: Morbid (severe) obesity due to excess calories: E66.01

## 2021-07-04 ENCOUNTER — Other Ambulatory Visit: Payer: Self-pay | Admitting: Cardiology

## 2021-09-12 ENCOUNTER — Encounter: Payer: Self-pay | Admitting: Cardiology

## 2021-09-12 ENCOUNTER — Ambulatory Visit: Payer: Medicare (Managed Care) | Admitting: Cardiology

## 2021-09-12 ENCOUNTER — Other Ambulatory Visit: Payer: Self-pay

## 2021-09-12 VITALS — BP 162/74 | HR 97 | Ht 70.0 in | Wt 286.0 lb

## 2021-09-12 DIAGNOSIS — I201 Angina pectoris with documented spasm: Secondary | ICD-10-CM

## 2021-09-12 DIAGNOSIS — E7849 Other hyperlipidemia: Secondary | ICD-10-CM | POA: Diagnosis not present

## 2021-09-12 DIAGNOSIS — I10 Essential (primary) hypertension: Secondary | ICD-10-CM

## 2021-09-12 MED ORDER — ISOSORBIDE MONONITRATE ER 30 MG PO TB24: 30.0000 mg | ORAL_TABLET | Freq: Every day | ORAL | 3 refills | Status: DC

## 2021-09-12 NOTE — Progress Notes (Signed)
Cardiology Office Note:    Date:  09/12/2021   ID:  Michael Webster, DOB 04/05/1956, MRN [id number]  PCP:  Gillian Scarce, MD  Cardiologist:  Norman Herrlich, MD    Referring MD: Gillian Scarce, MD    ASSESSMENT:    1. Coronary artery spasm (HCC)   2. Essential hypertension   3. Other hyperlipidemia    PLAN:    In order of problems listed above:  Yi continues to do well he has had no angina continue treatment including his oral nitrate aspirin calcium channel blocker and statin. Stable Home blood pressure runs 130/70 he will continue his current medications and trend at home Continue his statin   Next appointment: 1 year   Medication Adjustments/Labs and Tests Ordered: Current medicines are reviewed at length with the patient today.  Concerns regarding medicines are outlined above.  No orders of the defined types were placed in this encounter.  Meds ordered this encounter  Medications   isosorbide mononitrate (IMDUR) 30 MG 24 hr tablet    Sig: Take 1 tablet (30 mg total) by mouth daily.    Dispense:  90 tablet    Refill:  3    Chief complaint: Follow-up coronary artery spasm   History of Present Illness:    Michael Webster is a 66 y.o. male with a hx of myasthenia gravis non-ST elevation MI 01/04/2019 with normal coronary angiography presumed coronary artery spasm hypertension hyperlipidemia mild bilateral ICA atherosclerosis last seen 10/10/2020. Compliance with diet, lifestyle and medications: Yes  He continues to do well and has had no anginal discomfort.  No chest pain edema shortness of breath palpitation or syncope. Past Medical History:  Diagnosis Date   Congestive heart failure (CHF) (HCC)    Essential hypertension 08/08/2016   Gastroesophageal reflux disease 02/24/2018   GI bleed 07/18/2016   Hypertension    Myasthenia gravis (HCC)    NSTEMI (non-ST elevated myocardial infarction) (HCC) 01/03/2018   Other hyperlipidemia 05/01/2017   Snoring 03/12/2018    Stroke [facility])     Past Surgical History:  Procedure Laterality Date   LAPAROSCOPIC CHOLECYSTECTOMY     LEFT HEART CATH AND CORONARY ANGIOGRAPHY N/A 01/05/2018   Procedure: LEFT HEART CATH AND CORONARY ANGIOGRAPHY;  Surgeon: Lennette Bihari, MD;  Location: [facility];  Service: Cardiovascular;  Laterality: N/A;    Current Medications: Current Meds  Medication Sig   amLODipine (NORVASC) 5 MG tablet Take 1 tablet (5 mg total) by mouth daily. (Patient taking differently: Take 10 mg by mouth daily.)   aspirin EC 81 MG tablet Take 1 tablet (81 mg total) by mouth daily.   docusate sodium (COLACE) 100 MG capsule Take 100 mg by mouth daily as needed for mild constipation.   famotidine (PEPCID) 20 MG tablet Take 20 mg by mouth 2 (two) times daily.   hydrochlorothiazide (HYDRODIURIL) 25 MG tablet Take 1 tablet (25 mg total) by mouth daily.   mycophenolate (CELLCEPT) 500 MG tablet Take 500 mg by mouth 2 (two) times daily.   nitroGLYCERIN (NITROSTAT) 0.4 MG SL tablet DISSOLVE 1 TAB UNDER TONGUE FOR CHEST PAIN - IF PAIN REMAINS AFTER 5 MIN, CALL 911 AND REPEAT DOSE. MAX 3 TABS IN 15 MINUTES   pravastatin (PRAVACHOL) 40 MG tablet Take 40 mg by mouth every evening.   pyridostigmine (MESTINON) 60 MG tablet Take 60 mg by mouth 4 (four) times daily.   senna (SENOKOT) 8.6 MG TABS tablet Take 1 tablet by mouth daily as needed  for mild constipation.   [DISCONTINUED] isosorbide mononitrate (IMDUR) 30 MG 24 hr tablet Take 1 tablet (30 mg total) by mouth daily.     Allergies:   Acetaminophen, Aminoglycosides, Beta adrenergic blockers, Calcium channel blockers, Magnesium-containing compounds, Naproxen, Other, Penicillamine, Quinine derivatives, Tubocurarine, Calcium carbonate antacid, Erythromycin, Ibuprofen-famotidine, Magnesium hydroxide, and Naproxen sodium   Social History   Socioeconomic History   Marital status: Widowed    Spouse name: Not on file   Number of children: Not on file   Years of  education: Not on file   Highest education level: Not on file  Occupational History   Not on file  Tobacco Use   Smoking status: Never   Smokeless tobacco: Never  Vaping Use   Vaping Use: Never used  Substance and Sexual Activity   Alcohol use: Yes    Comment: Occasional use   Drug use: Never   Sexual activity: Not Currently  Other Topics Concern   Not on file  Social History Narrative   Not on file   Social Determinants of Health   Financial Resource Strain: Not on file  Food Insecurity: Not on file  Transportation Needs: Not on file  Physical Activity: Not on file  Stress: Not on file  Social Connections: Not on file     Family History: The patient's family history includes Breast cancer in his mother; Colon cancer in his father and paternal grandfather; Liver disease in his brother; Stroke in his father. There is no history of Heart attack. ROS:   Please see the history of present illness.    All other systems reviewed and are negative.  EKGs/Labs/Other Studies Reviewed:    The following studies were reviewed today:  EKG:  EKG ordered today and personally reviewed.  The ekg ordered today demonstrates sinus rhythm left bundle branch block  Recent Labs: 03/26/2021 cholesterol 158 LDL 80 HDL 60 triglycerides 92 lipids are at target CMP performed 03/26/2021 normal Recent Lipid Panel    Component Value Date/Time   CHOL 172 01/04/2018 0650   TRIG 122 01/04/2018 0650   HDL 54 01/04/2018 0650   CHOLHDL 3.2 01/04/2018 0650   VLDL 24 01/04/2018 0650   LDLCALC 94 01/04/2018 0650    Physical Exam:    VS:  BP (!) 162/74 (BP Location: Right Arm, Patient Position: Sitting, Cuff Size: Normal)    Pulse 97    Ht 5\' 10"  (1.778 m)    Wt 286 lb (129.7 kg)    SpO2 97%    BMI 41.04 kg/m     Wt Readings from Last 3 Encounters:  09/12/21 286 lb (129.7 kg)  10/10/20 279 lb 6.4 oz (126.7 kg)  09/28/19 294 lb 1.3 oz (133.4 kg)     GEN:  Well nourished, well developed in no  acute distress HEENT: Normal NECK: No JVD; No carotid bruits LYMPHATICS: No lymphadenopathy CARDIAC: RRR, no murmurs, rubs, gallops RESPIRATORY:  Clear to auscultation without rales, wheezing or rhonchi  ABDOMEN: Soft, non-tender, non-distended MUSCULOSKELETAL:  No edema; No deformity  SKIN: Warm and dry NEUROLOGIC:  Alert and oriented x 3 PSYCHIATRIC:  Normal affect    Signed, 11/26/19, MD  09/12/2021 10:16 AM    [facility]

## 2021-09-12 NOTE — Patient Instructions (Signed)

## 2021-09-12 NOTE — Addendum Note (Signed)
Addended by: Delorse Limber I on: 09/12/2021 10:30 AM   Modules accepted: Orders

## 2022-09-30 NOTE — Progress Notes (Unsigned)
Cardiology Office Note:    Date:  10/01/2022   ID:  Michael Webster, DOB [birth year], MRN [id number]  PCP:  Michael Hatchet, FNP  Cardiologist:  Michael More, MD    Referring MD: No ref. provider found    ASSESSMENT:    1. Coronary artery spasm ([place])   2. Essential hypertension   3. Other hyperlipidemia   4. Myasthenia gravis ([place])    PLAN:    In order of problems listed above:  Michael Webster continues to do well from a cardiology perspective he has had no recurrent angina presumed coronary artery spasm will continue his current medical regimen including low-dose aspirin oral nitrate and low intensity statin is well-tolerated with his neuromuscular disease. Stable Home blood pressure runs in the range of 135/70 LDL at target continue statin Managed by neurology doing well on current medication   Next appointment: 1 year is given a new prescription for nitroglycerin   Medication Adjustments/Labs and Tests Ordered: Current medicines are reviewed at length with the patient today.  Concerns regarding medicines are outlined above.  No orders of the defined types were placed in this encounter.  No orders of the defined types were placed in this encounter.   Chief Complaint  Patient presents with   Annual Exam    History of Present Illness:    Michael Webster is a 67 y.o. male with a hx of myasthenia gravis non-ST segment elevation MI 01/04/2019 with normal coronary arteriography presumed coronary artery spasm hypertension hyperlipidemia mild bilateral ICA atherosclerosis last seen 09/12/2021. Compliance with diet, lifestyle and medications: Yes  Had a good year he has had no anginal discomfort has not needed nitroglycerin tolerates his lipid-lowering therapy and his myasthenia has been stable on current medications Recent labs performed through [facility] September shows a cholesterol 168 triglycerides 168 HDL 51 LDL 83 normal CMP hemoglobin 15.0 platelets 220,000 Past  Medical History:  Diagnosis Date   Congestive heart failure (CHF) ([place])    Essential hypertension 08/08/2016   Gastroesophageal reflux disease 02/24/2018   GI bleed 07/18/2016   Hypertension    Myasthenia gravis ([place])    NSTEMI (non-ST elevated myocardial infarction) ([place]) 01/03/2018   Other hyperlipidemia 05/01/2017   Snoring 03/12/2018   Stroke [facility])     Past Surgical History:  Procedure Laterality Date   LAPAROSCOPIC CHOLECYSTECTOMY     LEFT HEART CATH AND CORONARY ANGIOGRAPHY N/A 01/05/2018   Procedure: LEFT HEART CATH AND CORONARY ANGIOGRAPHY;  Surgeon: Troy Sine, MD;  Location: [facility];  Service: Cardiovascular;  Laterality: N/A;    Current Medications: Current Meds  Medication Sig   amLODipine (NORVASC) 10 MG tablet Take 10 mg by mouth daily.   aspirin EC 81 MG tablet Take 1 tablet (81 mg total) by mouth daily.   docusate sodium (COLACE) 100 MG capsule Take 100 mg by mouth daily as needed for mild constipation.   famotidine (PEPCID) 20 MG tablet Take 20 mg by mouth 2 (two) times daily.   hydrochlorothiazide (HYDRODIURIL) 25 MG tablet Take 1 tablet (25 mg total) by mouth daily.   isosorbide mononitrate (IMDUR) 30 MG 24 hr tablet Take 1 tablet (30 mg total) by mouth daily.   mycophenolate (CELLCEPT) 500 MG tablet Take 1,000 mg by mouth 2 (two) times daily.   nitroGLYCERIN (NITROSTAT) 0.4 MG SL tablet DISSOLVE 1 TAB UNDER TONGUE FOR CHEST PAIN - IF PAIN REMAINS AFTER 5 MIN, CALL 911 AND REPEAT DOSE. MAX 3 TABS IN 15 MINUTES  pravastatin (PRAVACHOL) 40 MG tablet Take 40 mg by mouth every evening.   pyridostigmine (MESTINON) 60 MG tablet Take 60 mg by mouth 4 (four) times daily.   senna (SENOKOT) 8.6 MG TABS tablet Take 1 tablet by mouth daily as needed for mild constipation.     Allergies:   Acetaminophen, Aminoglycosides, Beta adrenergic blockers, Calcium channel blockers, Magnesium-containing compounds, Naproxen, Other, Penicillamine, Quinine derivatives,  Tubocurarine, Calcium carbonate antacid, Erythromycin, Ibuprofen-famotidine, Magnesium hydroxide, and Naproxen sodium   Social History   Socioeconomic History   Marital status: Widowed    Spouse name: Not on file   Number of children: Not on file   Years of education: Not on file   Highest education level: Not on file  Occupational History   Not on file  Tobacco Use   Smoking status: Never   Smokeless tobacco: Never  Vaping Use   Vaping Use: Never used  Substance and Sexual Activity   Alcohol use: Yes    Comment: Occasional use   Drug use: Never   Sexual activity: Not Currently  Other Topics Concern   Not on file  Social History Narrative   Not on file   Social Determinants of Health   Financial Resource Strain: Not on file  Food Insecurity: Not on file  Transportation Needs: Not on file  Physical Activity: Not on file  Stress: Not on file  Social Connections: Not on file     Family History: The patient's family history includes Breast cancer in his mother; Colon cancer in his father and paternal grandfather; Liver disease in his brother; Stroke in his father. There is no history of Heart attack. ROS:   Please see the history of present illness.    All other systems reviewed and are negative.  EKGs/Labs/Other Studies Reviewed:    The following studies were reviewed today:  EKG:  EKG ordered today and personally reviewed.  The ekg ordered today demonstrates sinus sinus rhythm he has nonspecific conduction delay with left axis deviation   recent Labs: See history     Physical Exam:    VS:  BP (!) 150/90 (BP Location: Right Arm, Patient Position: Sitting, Cuff Size: Normal)   Pulse 77   Ht 5' 10"$  (1.778 m)   Wt 299 lb (135.6 kg)   SpO2 94%   BMI 42.90 kg/m     Wt Readings from Last 3 Encounters:  10/01/22 299 lb (135.6 kg)  09/12/21 286 lb (129.7 kg)  10/10/20 279 lb 6.4 oz (126.7 kg)     GEN:  Well nourished, well developed in no acute distress use  BMI exceeds 40 HEENT: Normal NECK: No JVD; No carotid bruits LYMPHATICS: No lymphadenopathy CARDIAC: RRR, no murmurs, rubs, gallops RESPIRATORY:  Clear to auscultation without rales, wheezing or rhonchi  ABDOMEN: Soft, non-tender, non-distended MUSCULOSKELETAL:  No edema; No deformity  SKIN: Warm and dry NEUROLOGIC:  Alert and oriented x 3 PSYCHIATRIC:  Normal affect   Seen with Joella Prince, CMA chaperone   Signed, Michael More, MD  10/01/2022 1:27 PM    [facility]

## 2022-10-01 ENCOUNTER — Encounter: Payer: Self-pay | Admitting: Cardiology

## 2022-10-01 ENCOUNTER — Ambulatory Visit: Payer: Medicare HMO | Attending: Cardiology | Admitting: Cardiology

## 2022-10-01 VITALS — BP 150/90 | HR 77 | Ht 70.0 in | Wt 299.0 lb

## 2022-10-01 DIAGNOSIS — I10 Essential (primary) hypertension: Secondary | ICD-10-CM

## 2022-10-01 DIAGNOSIS — E7849 Other hyperlipidemia: Secondary | ICD-10-CM

## 2022-10-01 DIAGNOSIS — I201 Angina pectoris with documented spasm: Secondary | ICD-10-CM | POA: Diagnosis not present

## 2022-10-01 DIAGNOSIS — G7000 Myasthenia gravis without (acute) exacerbation: Secondary | ICD-10-CM

## 2022-10-01 MED ORDER — ISOSORBIDE MONONITRATE ER 30 MG PO TB24: 30.0000 mg | ORAL_TABLET | Freq: Every day | ORAL | 3 refills | Status: DC

## 2022-10-01 MED ORDER — NITROGLYCERIN 0.4 MG SL SUBL: 0.4000 mg | SUBLINGUAL_TABLET | SUBLINGUAL | 3 refills | Status: AC | PRN

## 2022-10-01 NOTE — Patient Instructions (Signed)
Medication Instructions:  Your physician recommends that you continue on your current medications as directed. Please refer to the Current Medication list given to you today.  *If you need a refill on your cardiac medications before your next appointment, please call your pharmacy*   Lab Work: None If you have labs (blood work) drawn today and your tests are completely normal, you will receive your results only by: [place] (if you have MyChart) OR A paper copy in the mail If you have any lab test that is abnormal or we need to change your treatment, we will call you to review the results.   Testing/Procedures: None   Follow-Up: At [facility], you and your health needs are our priority.  As part of our continuing mission to provide you with exceptional heart care, we have created designated Provider Care Teams.  These Care Teams include your primary Cardiologist (physician) and Advanced Practice Providers (APPs -  Physician Assistants and Nurse Practitioners) who all work together to provide you with the care you need, when you need it.  We recommend signing up for the patient portal called "MyChart".  Sign up information is provided on this After Visit Summary.  MyChart is used to connect with patients for Virtual Visits (Telemedicine).  Patients are able to view lab/test results, encounter notes, upcoming appointments, etc.  Non-urgent messages can be sent to your provider as well.   To learn more about what you can do with MyChart, go to [email or web address].    Your next appointment:   1 year(s)  Provider:   Shirlee More, MD    Other Instructions None

## 2023-09-28 ENCOUNTER — Other Ambulatory Visit: Payer: Self-pay | Admitting: Cardiology

## 2023-09-28 DIAGNOSIS — I201 Angina pectoris with documented spasm: Secondary | ICD-10-CM

## 2023-10-01 ENCOUNTER — Encounter: Payer: Self-pay | Admitting: Cardiology

## 2023-10-02 NOTE — Progress Notes (Unsigned)
Cardiology Office Note:    Date:  10/03/2023   ID:  Michael Webster, DOB Jan 03, 1956, MRN [id number]  PCP:  Adolph Pollack, FNP  Cardiologist:  Norman Herrlich, MD    Referring MD: Adolph Pollack, FNP    ASSESSMENT:    1. Coronary artery spasm (HCC)   2. Essential hypertension   3. Mixed hyperlipidemia   4. Myasthenia gravis (HCC)    PLAN:    In order of problems listed above:  Michael Webster is done well no further anginal discomfort continue his current medical regimen including long-acting oral nitrate aspirin and his statin. Well-controlled I told him all blood pressures in the future with a large cuff because of his body mass at home he runs in the range of 130/70 repeat by me in the office at target Controlled LDL at target continue the low intensity statin because of his myasthenia gravis Stable managed by neurology   Next appointment: 1 year   Medication Adjustments/Labs and Tests Ordered: Current medicines are reviewed at length with the patient today.  Concerns regarding medicines are outlined above.  Orders Placed This Encounter  Procedures   EKG 12-Lead   No orders of the defined types were placed in this encounter.    History of Present Illness:    Michael Webster is a 68 y.o. male with a hx of coronary artery spasm hypertension hyperlipidemia and myasthenia gravis last seen 10/01/2022.  He had non-ST segment elevation MI May 2020 with normal coronary arteriography has bilateral mild ICA atherosclerosis. Compliance with diet, lifestyle and medications: Yes  Piotr continues to do well he is not having angina edema shortness of breath chest pain palpitation or syncope. Observe follow-up with his PCP cholesterol 158 non-HDL cholesterol 107 LDL cholesterol 85 with a low intensity statin because of his myasthenia office Past Medical History:  Diagnosis Date   Congestive heart failure (CHF) (HCC)    Coronary artery spasm (HCC) 10/14/2018   Essential hypertension  08/08/2016   Gastroesophageal reflux disease 02/24/2018   GI bleed 07/18/2016   History of non-ST elevation myocardial infarction (NSTEMI) 01/03/2018   Formatting of this note might be different from the original. 02/24/18 visit-NSTEMI: Patient had NSTEMI since his last office visit, he is scheduled to begin cardiac rehab on Thursday. [facility] admission 01/03/18-01/06/18 for NSTEMI.     Hypertension    Morbid obesity with BMI of 40.0-44.9, adult (HCC) 04/25/2021   Formatting of this note might be different from the original. 09/18/20 visit note- HCC doc.-Morbid (severe) obesity due to excess calories,Body mass index (BMI) 40.0-44.9, adult, Stable based upon symptoms and exam. Continue current treatment plan and follow up at least yearly.     Myasthenia gravis (HCC)    NSTEMI (non-ST elevated myocardial infarction) (HCC) 01/03/2018   OSA (obstructive sleep apnea) 09/10/2018   Formatting of this note might be different from the original. HST 09/09/18 AHI 16.2     Other hyperlipidemia 05/01/2017   Snoring 03/12/2018   Stroke (HCC)     Current Medications: Current Meds  Medication Sig   amLODipine (NORVASC) 10 MG tablet Take 10 mg by mouth daily.   aspirin EC 81 MG tablet Take 1 tablet (81 mg total) by mouth daily.   docusate sodium (COLACE) 100 MG capsule Take 100 mg by mouth daily as needed for mild constipation.   dorzolamide-timolol (COSOPT) 2-0.5 % ophthalmic solution Place 1 drop into both eyes 2 (two) times daily.   famotidine (PEPCID) 20 MG tablet Take 20 mg  by mouth 2 (two) times daily.   hydrochlorothiazide (HYDRODIURIL) 25 MG tablet Take 1 tablet (25 mg total) by mouth daily.   isosorbide mononitrate (IMDUR) 30 MG 24 hr tablet Take 1 tablet (30 mg total) by mouth daily. 1rst attempt, patient needs and appt for additional refills   ketotifen (ZADITOR) 0.035 % ophthalmic solution 1 drop 2 (two) times daily.   latanoprost (XALATAN) 0.005 % ophthalmic solution Place 1 drop into both eyes  at bedtime.   mycophenolate (CELLCEPT) 500 MG tablet Take 1,000 mg by mouth 2 (two) times daily.   nitroGLYCERIN (NITROSTAT) 0.4 MG SL tablet Place 1 tablet (0.4 mg total) under the tongue every 5 (five) minutes as needed for chest pain. DISSOLVE 1 TAB UNDER TONGUE FOR CHEST PAIN - IF PAIN REMAINS AFTER 5 MIN, CALL 911 AND REPEAT DOSE. MAX 3 TABS IN 15 MINUTES   pravastatin (PRAVACHOL) 40 MG tablet Take 40 mg by mouth every evening.   pyridostigmine (MESTINON) 60 MG tablet Take 60 mg by mouth 4 (four) times daily.   senna (SENOKOT) 8.6 MG TABS tablet Take 1 tablet by mouth daily as needed for mild constipation.      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 EKG Interpretation Date/Time:  Friday October 03 2023 12:46:04 EST Ventricular Rate:  86 PR Interval:  210 QRS Duration:  158 QT Interval:  436 QTC Calculation: 521 R Axis:   270  Text Interpretation: Sinus rhythm with 1st degree A-V block Right bundle branch block Left anterior fascicular block Bifascicular block ) When compared with ECG of 04-Jan-2018 01:14, No significant change Confirmed by Norman Herrlich ([id number]) on 10/03/2023 1:24:32 PM    Physical Exam:    VS:  BP (!) 152/98   Pulse 86   Ht 5\' 10"  (1.778 m)   Wt 295 lb 6.4 oz (134 kg)   SpO2 98%   BMI 42.39 kg/m   obese  Wt Readings from Last 3 Encounters:  10/03/23 295 lb 6.4 oz (134 kg)  10/01/22 299 lb (135.6 kg)  09/12/21 286 lb (129.7 kg)     GEN:  Well nourished, well developed in no acute distress HEENT: Normal NECK: No JVD; No carotid bruits LYMPHATICS: No lymphadenopathy CARDIAC: RRR, no murmurs, rubs, gallops RESPIRATORY:  Clear to auscultation without rales, wheezing or rhonchi  ABDOMEN: Soft, non-tender, non-distended MUSCULOSKELETAL:  No edema; No deformity  SKIN: Warm and dry NEUROLOGIC:  Alert and oriented x  3 PSYCHIATRIC:  Normal affect    Signed, Norman Herrlich, MD  10/03/2023 1:02 PM    [facility]

## 2023-10-03 ENCOUNTER — Ambulatory Visit: Payer: Medicare HMO | Attending: Cardiology | Admitting: Cardiology

## 2023-10-03 ENCOUNTER — Encounter: Payer: Self-pay | Admitting: Cardiology

## 2023-10-03 VITALS — BP 136/80 | HR 86 | Ht 70.0 in | Wt 295.4 lb

## 2023-10-03 DIAGNOSIS — I201 Angina pectoris with documented spasm: Secondary | ICD-10-CM | POA: Diagnosis not present

## 2023-10-03 DIAGNOSIS — G7000 Myasthenia gravis without (acute) exacerbation: Secondary | ICD-10-CM | POA: Diagnosis not present

## 2023-10-03 DIAGNOSIS — I10 Essential (primary) hypertension: Secondary | ICD-10-CM | POA: Diagnosis not present

## 2023-10-03 DIAGNOSIS — E782 Mixed hyperlipidemia: Secondary | ICD-10-CM | POA: Diagnosis not present

## 2023-10-03 MED ORDER — ISOSORBIDE MONONITRATE ER 30 MG PO TB24: 30.0000 mg | ORAL_TABLET | Freq: Every day | ORAL | 3 refills | Status: DC

## 2023-10-03 NOTE — Patient Instructions (Signed)
Medication Instructions:  Your physician recommends that you continue on your current medications as directed. Please refer to the Current Medication list given to you today.  *If you need a refill on your cardiac medications before your next appointment, please call your pharmacy*   Lab Work: None If you have labs (blood work) drawn today and your tests are completely normal, you will receive your results only by: MyChart Message (if you have MyChart) OR A paper copy in the mail If you have any lab test that is abnormal or we need to change your treatment, we will call you to review the results.   Testing/Procedures: None   Follow-Up: At [facility], you and your health needs are our priority.  As part of our continuing mission to provide you with exceptional heart care, we have created designated Provider Care Teams.  These Care Teams include your primary Cardiologist (physician) and Advanced Practice Providers (APPs -  Physician Assistants and Nurse Practitioners) who all work together to provide you with the care you need, when you need it.  We recommend signing up for the patient portal called "MyChart".  Sign up information is provided on this After Visit Summary.  MyChart is used to connect with patients for Virtual Visits (Telemedicine).  Patients are able to view lab/test results, encounter notes, upcoming appointments, etc.  Non-urgent messages can be sent to your provider as well.   To learn more about what you can do with MyChart, go to [email or web address].    Your next appointment:   1 year(s)  Provider:   Norman Herrlich, MD    Other Instructions Dr. Dulce Sellar would like you to start using your smart watch

## 2024-02-14 ENCOUNTER — Other Ambulatory Visit: Payer: Self-pay | Admitting: Cardiology

## 2024-02-14 DIAGNOSIS — I201 Angina pectoris with documented spasm: Secondary | ICD-10-CM

## 2024-10-01 ENCOUNTER — Ambulatory Visit: Payer: Self-pay | Admitting: Cardiology
